# Patient Record
Sex: Male | Born: 1955 | Race: White | Hispanic: No | Marital: Married | Smoking: Never smoker
Health system: Southern US, Community
[De-identification: ages and names within clinical notes are randomized; demographics above are authoritative.]

## PROBLEM LIST (undated history)

## (undated) DIAGNOSIS — F319 Bipolar disorder, unspecified: Secondary | ICD-10-CM

## (undated) DIAGNOSIS — K219 Gastro-esophageal reflux disease without esophagitis: Secondary | ICD-10-CM

## (undated) DIAGNOSIS — K589 Irritable bowel syndrome without diarrhea: Secondary | ICD-10-CM

## (undated) DIAGNOSIS — E785 Hyperlipidemia, unspecified: Secondary | ICD-10-CM

## (undated) DIAGNOSIS — I2510 Atherosclerotic heart disease of native coronary artery without angina pectoris: Secondary | ICD-10-CM

## (undated) DIAGNOSIS — I10 Essential (primary) hypertension: Secondary | ICD-10-CM

## (undated) HISTORY — DX: Hyperlipidemia, unspecified: E78.5

## (undated) HISTORY — DX: Atherosclerotic heart disease of native coronary artery without angina pectoris: I25.10

## (undated) HISTORY — DX: Gastro-esophageal reflux disease without esophagitis: K21.9

## (undated) HISTORY — DX: Essential (primary) hypertension: I10

## (undated) HISTORY — DX: Bipolar disorder, unspecified: F31.9

## (undated) HISTORY — DX: Irritable bowel syndrome, unspecified: K58.9

---

## 2000-10-26 HISTORY — PX: APPENDECTOMY: SHX54

## 2000-12-12 ENCOUNTER — Encounter: Payer: Self-pay | Admitting: Surgery

## 2000-12-12 ENCOUNTER — Inpatient Hospital Stay (HOSPITAL_COMMUNITY): Admission: EM | Admit: 2000-12-12 | Discharge: 2000-12-20 | Payer: Self-pay | Admitting: Emergency Medicine

## 2005-01-26 HISTORY — PX: TRANSTHORACIC ECHOCARDIOGRAM: SHX275

## 2005-01-30 ENCOUNTER — Inpatient Hospital Stay (HOSPITAL_COMMUNITY): Admission: EM | Admit: 2005-01-30 | Discharge: 2005-02-01 | Payer: Self-pay | Admitting: Emergency Medicine

## 2005-03-17 ENCOUNTER — Ambulatory Visit (HOSPITAL_COMMUNITY): Admission: RE | Admit: 2005-03-17 | Discharge: 2005-03-18 | Payer: Self-pay | Admitting: Cardiovascular Disease

## 2005-03-17 HISTORY — PX: CARDIAC CATHETERIZATION: SHX172

## 2005-04-14 ENCOUNTER — Ambulatory Visit (HOSPITAL_COMMUNITY): Admission: RE | Admit: 2005-04-14 | Discharge: 2005-04-15 | Payer: Self-pay | Admitting: Cardiovascular Disease

## 2005-04-14 HISTORY — PX: CARDIAC CATHETERIZATION: SHX172

## 2009-01-26 HISTORY — PX: NM MYOCAR PERF WALL MOTION: HXRAD629

## 2011-01-08 ENCOUNTER — Ambulatory Visit (INDEPENDENT_AMBULATORY_CARE_PROVIDER_SITE_OTHER): Payer: PRIVATE HEALTH INSURANCE

## 2011-01-08 DIAGNOSIS — M5430 Sciatica, unspecified side: Secondary | ICD-10-CM

## 2011-01-08 DIAGNOSIS — M545 Low back pain: Secondary | ICD-10-CM

## 2011-05-21 ENCOUNTER — Ambulatory Visit (INDEPENDENT_AMBULATORY_CARE_PROVIDER_SITE_OTHER): Payer: PRIVATE HEALTH INSURANCE | Admitting: Family Medicine

## 2011-05-21 VITALS — BP 115/75 | HR 66 | Temp 98.1°F | Resp 16 | Ht 67.0 in | Wt 202.4 lb

## 2011-05-21 DIAGNOSIS — D649 Anemia, unspecified: Secondary | ICD-10-CM

## 2011-05-21 LAB — POCT CBC
Hemoglobin: 11.5 g/dL — AB (ref 14.1–18.1)
Lymph, poc: 2.5 (ref 0.6–3.4)
MCHC: 31.2 g/dL — AB (ref 31.8–35.4)
MID (cbc): 0.5 (ref 0–0.9)
MPV: 11 fL (ref 0–99.8)
POC Granulocyte: 3.8 (ref 2–6.9)
POC MID %: 7.5 %M (ref 0–12)
Platelet Count, POC: 149 10*3/uL (ref 142–424)
RDW, POC: 15.4 %

## 2011-05-21 LAB — POCT SEDIMENTATION RATE: POCT SED RATE: 22 mm/hr (ref 0–22)

## 2011-05-21 MED ORDER — PRENATAL PLUS 27-1 MG PO TABS: 1.0000 | ORAL_TABLET | Freq: Every day | ORAL | Status: DC

## 2011-05-21 NOTE — Progress Notes (Signed)
  Subjective:    Patient ID: Harold Newman, male    DOB: 1955/11/27, 56 y.o.   MRN: [id number]  HPI  Recent OV with Dr. Haywood Lasso and noted that patient appeared pale and ordered a CBC. Anemia noted and patient referred here.  Some fatigue, was able to walk 2 miles the other day without fatigue Some leg cramping  Denies rectal bleeding or UGI symptoms.  Diet low in foods with Iron  Denies unintentional weight loss Denies LE edema  Has been taking OTC iron and has felt much better  Never had colonoscopy; Did have double contrast BE 2002 and was told colon was normal No history of rectal bleeding;  PMH/ GERD           Mood Disorder  Primary MD- Dr. Foy Guadalajara  Review of Systems     Objective:   Physical Exam  Constitutional: He appears well-developed.  HENT:  Head: Normocephalic.  Mouth/Throat: Oropharynx is clear and moist.  Neck: Neck supple. No thyromegaly present.  Cardiovascular: Normal rate, regular rhythm and normal heart sounds.   Pulmonary/Chest: Effort normal and breath sounds normal.  Abdominal: Soft. Bowel sounds are normal. There is no hepatosplenomegaly.  Genitourinary: Rectum normal. Guaiac negative stool.  Lymphadenopathy:    He has no cervical adenopathy.  Skin: No rash noted.        Assessment & Plan:   1. Anemia  POCT CBC, POCT SEDIMENTATION RATE, Ferritin, prenatal vitamin w/FE, FA (PRENATAL 1 + 1) 27-1 MG TABS, IFOBT POC (occult bld, rslt in office)   Patient advised the importance of a diagnostic colonoscopy given age and anemia. Patient declined our practice to schedule referral. Patient insists that he will schedule colonoscopy at [facility]. Aware without a colonoscopy he can miss early diagnosis of colon cancer

## 2011-05-22 ENCOUNTER — Telehealth: Payer: Self-pay

## 2011-05-22 NOTE — Telephone Encounter (Signed)
Labs not back yet. Pt notified.

## 2011-05-22 NOTE — Telephone Encounter (Signed)
Pt would like to know if labs are in.

## 2011-05-28 ENCOUNTER — Telehealth: Payer: Self-pay | Admitting: Family Medicine

## 2011-05-28 ENCOUNTER — Other Ambulatory Visit: Payer: Self-pay | Admitting: Family Medicine

## 2011-05-28 DIAGNOSIS — K589 Irritable bowel syndrome without diarrhea: Secondary | ICD-10-CM

## 2011-05-28 MED ORDER — DICYCLOMINE HCL 10 MG PO CAPS: 10.0000 mg | ORAL_CAPSULE | Freq: Three times a day (TID) | ORAL | Status: DC

## 2011-05-28 NOTE — Telephone Encounter (Signed)
LM on VM asking patient for return call to discuss labs. Needs colonoscopy.

## 2011-05-29 ENCOUNTER — Telehealth: Payer: Self-pay

## 2011-05-29 NOTE — Telephone Encounter (Signed)
Pt states that he was seen last Thursday and had a stool sample taken and would like to know the results.

## 2011-05-29 NOTE — Telephone Encounter (Signed)
PLEASE MAIL HIM A COPY OF THE RECENT LAB RESULTS.

## 2011-05-31 NOTE — Telephone Encounter (Signed)
Test done in office was negative

## 2011-05-31 NOTE — Telephone Encounter (Signed)
ADVISED PT OF LAB RESULTS.

## 2011-05-31 NOTE — Telephone Encounter (Signed)
Duplicate message. 

## 2011-05-31 NOTE — Telephone Encounter (Signed)
Left a message for patient to call back with clarification for which labs he is speaking of. Did he bring in stool samples or was it just in house?

## 2011-07-12 ENCOUNTER — Ambulatory Visit (INDEPENDENT_AMBULATORY_CARE_PROVIDER_SITE_OTHER): Payer: PRIVATE HEALTH INSURANCE | Admitting: Emergency Medicine

## 2011-07-12 VITALS — BP 128/83 | HR 69 | Temp 98.1°F | Resp 16 | Ht 68.0 in | Wt 201.0 lb

## 2011-07-12 DIAGNOSIS — D649 Anemia, unspecified: Secondary | ICD-10-CM

## 2011-07-12 LAB — POCT CBC
Granulocyte percent: 44.6 %G (ref 37–80)
Hemoglobin: 11.7 g/dL — AB (ref 14.1–18.1)
MPV: 10.5 fL (ref 0–99.8)
POC Granulocyte: 2.4 (ref 2–6.9)
POC MID %: 8.9 %M (ref 0–12)
RBC: 4.42 M/uL — AB (ref 4.69–6.13)
WBC: 5.3 10*3/uL (ref 4.6–10.2)

## 2011-07-12 NOTE — Progress Notes (Signed)
Patient Name: Harold Newman Date of Birth: September 15, 1955 Medical Record Number: [id number] Gender: male Date of Encounter: 07/12/2011  History of Present Illness:  Harold Newman is a 56 y.o. very pleasant male patient who presents with the following:  History of anemia and scheduled for colonoscopy to evaluate wants to obtain a fresh CBC to re-evaluate his anemia No symptoms and no hematochezia, abdominal pain, stool changes, or melena or upper GI complaints  There is no problem list on file for this patient.  No past medical history on file. No past surgical history on file. History  Substance Use Topics  . Smoking status: Never Smoker   . Smokeless tobacco: Not on file  . Alcohol Use: Not on file   No family history on file. No Known Allergies  Medication list has been reviewed and updated.  Prior to Admission medications   Medication Sig Start Date End Date Taking? Authorizing Provider  amLODipine (NORVASC) 5 MG tablet Take 5 mg by mouth daily.   Yes Historical Provider, MD  aspirin 81 MG tablet Take 81 mg by mouth daily.   Yes Historical Provider, MD  clopidogrel (PLAVIX) 75 MG tablet Take 75 mg by mouth daily.   Yes Historical Provider, MD  co-enzyme Q-10 30 MG capsule Take 200 mg by mouth daily.   Yes Historical Provider, MD  D-Ribose POWD 5 g by Does not apply route daily.   Yes Historical Provider, MD  dicyclomine (BENTYL) 10 MG capsule Take 1 capsule (10 mg total) by mouth 4 (four) times daily -  before meals and at bedtime. 05/28/11 05/27/12 Yes Dois Davenport, MD  IRON PO Take 30 mg by mouth daily.   Yes Historical Provider, MD  lansoprazole (PREVACID) 15 MG capsule Take 30 mg by mouth daily.    Yes Historical Provider, MD  LevOCARNitine L-Tartrate (L-CARNITINE) 500 MG CAPS Take 500 mg by mouth daily.   Yes Historical Provider, MD  lithium carbonate (ESKALITH) 450 MG CR tablet Take 675 mg by mouth daily.   Yes Historical Provider, MD  LORazepam (ATIVAN) 1 MG tablet  Take 1 mg by mouth every 8 (eight) hours.   Yes Historical Provider, MD  losartan (COZAAR) 25 MG tablet Take 25 mg by mouth daily.   Yes Historical Provider, MD  metoprolol succinate (TOPROL-XL) 100 MG 24 hr tablet Take 37.5 mg by mouth daily. Take with or immediately following a meal.   Yes Historical Provider, MD  Multiple Vitamins-Minerals (MULTIVITAMIN WITH MINERALS) tablet Take 1 tablet by mouth daily.   Yes Historical Provider, MD  niacin (NIASPAN) 1000 MG CR tablet Take 1,000 mg by mouth at bedtime.   Yes Historical Provider, MD  rosuvastatin (CRESTOR) 40 MG tablet Take 40 mg by mouth daily.   Yes Historical Provider, MD  vitamin B-12 (CYANOCOBALAMIN) 1000 MCG tablet Take 1,000 mcg by mouth 5 (five) times daily.   Yes Historical Provider, MD  prenatal vitamin w/FE, FA (PRENATAL 1 + 1) 27-1 MG TABS Take 1 tablet by mouth daily. 05/21/11   Dois Davenport, MD    Review of Systems:  As per HPI, otherwise negative.    Physical Examination: Filed Vitals:   07/12/11 1110  BP: 128/83  Pulse: 69  Temp: 98.1 F (36.7 C)  Resp: 16   Filed Vitals:   07/12/11 1110  Height: 5\' 8"  (1.727 m)  Weight: 201 lb (91.173 kg)   Body mass index is 30.56 kg/(m^2). Ideal Body Weight: Weight in (lb) to  have BMI = 25: 164.1    GEN: WDWN, NAD, Non-toxic, Alert & Oriented x 3 HEENT: Atraumatic, Normocephalic.  Ears and Nose: No external deformity. EXTR: No clubbing/cyanosis/edema NEURO: Normal gait.  PSYCH: Normally interactive. Conversant. Not depressed or anxious appearing.  Calm demeanor.   Assessment and Plan: Check CBC and follow up with GI physician.  Carmelina Dane, MD

## 2011-07-12 NOTE — Progress Notes (Signed)
  Subjective:    Patient ID: Harold Newman, male    DOB: [birth year], 56 y.o.   MRN: [id number]  HPI    Review of Systems     Objective:   Physical Exam        Assessment & Plan:

## 2011-07-12 NOTE — Progress Notes (Signed)
  Subjective:    Patient ID: Harold Newman, male    DOB: 07/14/1955, 56 y.o.   MRN: [id number]  HPI    Review of Systems     Objective:   Physical Exam        Assessment & Plan:   Results for orders placed in visit on 07/12/11  POCT CBC      Component Value Range   WBC 5.3  4.6 - 10.2 K/uL   Lymph, poc 2.5  0.6 - 3.4   POC LYMPH PERCENT 46.5  10 - 50 %L   MID (cbc) 0.5  0 - 0.9   POC MID % 8.9  0 - 12 %M   POC Granulocyte 2.4  2 - 6.9   Granulocyte percent 44.6  37 - 80 %G   RBC 4.42 (*) 4.69 - 6.13 M/uL   Hemoglobin 11.7 (*) 14.1 - 18.1 g/dL   HCT, POC 34.5 (*) 43.5 - 53.7 %   MCV 78.0 (*) 80 - 97 fL   MCH, POC 26.5 (*) 27 - 31.2 pg   MCHC 33.9  31.8 - 35.4 g/dL   RDW, POC 18.3     Platelet Count, POC 144  142 - 424 K/uL   MPV 10.5  0 - 99.8 fL     

## 2011-07-12 NOTE — Progress Notes (Signed)
  Subjective:    Patient ID: Harold Newman, male    DOB: 10/01/1955, 56 y.o.   MRN: [id number]  HPI    Review of Systems     Objective:   Physical Exam        Assessment & Plan:   Results for orders placed in visit on 07/12/11  POCT CBC      Component Value Range   WBC 5.3  4.6 - 10.2 K/uL   Lymph, poc 2.5  0.6 - 3.4   POC LYMPH PERCENT 46.5  10 - 50 %L   MID (cbc) 0.5  0 - 0.9   POC MID % 8.9  0 - 12 %M   POC Granulocyte 2.4  2 - 6.9   Granulocyte percent 44.6  37 - 80 %G   RBC 4.42 (*) 4.69 - 6.13 M/uL   Hemoglobin 11.7 (*) 14.1 - 18.1 g/dL   HCT, POC 14.4 (*) 31.5 - 53.7 %   MCV 78.0 (*) 80 - 97 fL   MCH, POC 26.5 (*) 27 - 31.2 pg   MCHC 33.9  31.8 - 35.4 g/dL   RDW, POC 40.0     Platelet Count, POC 144  142 - 424 K/uL   MPV 10.5  0 - 99.8 fL

## 2011-08-27 DIAGNOSIS — K589 Irritable bowel syndrome without diarrhea: Secondary | ICD-10-CM | POA: Insufficient documentation

## 2011-08-27 HISTORY — PX: COLONOSCOPY: SHX174

## 2012-08-26 ENCOUNTER — Encounter: Payer: Self-pay | Admitting: Cardiovascular Disease

## 2012-08-31 ENCOUNTER — Telehealth: Payer: Self-pay | Admitting: Cardiovascular Disease

## 2012-08-31 MED ORDER — METOPROLOL SUCCINATE ER 25 MG PO TB24: ORAL_TABLET | ORAL | Status: DC

## 2012-08-31 MED ORDER — AMLODIPINE BESYLATE 5 MG PO TABS: 5.0000 mg | ORAL_TABLET | Freq: Every day | ORAL | Status: DC

## 2012-08-31 MED ORDER — ROSUVASTATIN CALCIUM 40 MG PO TABS: 40.0000 mg | ORAL_TABLET | Freq: Every day | ORAL | Status: DC

## 2012-08-31 NOTE — Telephone Encounter (Signed)
Pt called back and informed samples will be left at front desk.  Pt stated he needs to pick up Rxs for Norvasc and Metoprolol b/c he sends them to [place].  Pt informed Dr. Pierre Bali, CMA will be notified.  Pt also informed he may not receive 3 refills as he has not been seen since Nov 2013 and refills are usually given until the next appt.  Pt verbalized understanding and stated a 90-day supply will be sufficient.    Message forwarded to Dr. Pierre Bali, CMA.  Envelope w/ samples on [name]'s desk awaiting signed Rx for refills to be placed with them.  Please call pt when complete.

## 2012-08-31 NOTE — Telephone Encounter (Signed)
Amlodipine 5 mg and Toprol XL 25 MG refills printed for patient to pick up.

## 2012-08-31 NOTE — Telephone Encounter (Signed)
Would like to pick up some samples of Crestor 20 mg please. Also wants to pick up a prescription for his Norvasc 5 mg #90 and 3 refills and  Metoprolol 50 mg #90 and 3 refills.

## 2012-08-31 NOTE — Telephone Encounter (Signed)
Returned call.  Left message to call back before 4pm.  Samples left at front desk for pt pick up.  Lot: [id number] Exp: 03/2015.

## 2012-09-01 NOTE — Telephone Encounter (Signed)
Returned call.  Left message that everything requested is at the front desk for pick up.

## 2012-11-18 ENCOUNTER — Telehealth: Payer: Self-pay | Admitting: Cardiovascular Disease

## 2012-11-18 MED ORDER — ROSUVASTATIN CALCIUM 20 MG PO TABS: 40.0000 mg | ORAL_TABLET | Freq: Every day | ORAL | Status: DC

## 2012-11-18 NOTE — Telephone Encounter (Signed)
Returned call.  Left message to call back before 4pm.  Pt overdue for f/u appt and this was explained to him on 8.6.14 telephone call.  Appt was due in May 2014 and pt is scheduled to be seen December 2014.  Pt will need to schedule sooner appt w/ Dr. Tresa Endo.  Will inform when call returned.

## 2012-11-18 NOTE — Telephone Encounter (Signed)
Would like some samples of 20 mg Crestor .Marland KitchenWould like to have a prescription written 50 mg Toprol XL and he will pick it up .Marland Kitchen  Please call   T

## 2012-11-18 NOTE — Telephone Encounter (Signed)
Pt called back and verified x 2.  Pt informed message received and samples are available.  Pt also informed he will need a sooner appt since he was supposed to be seen in May, informed of this in August and appt not scheduled until December.  Pt stated he is currently uninsured and will not be getting insurance until late November/early December.  Pt informed RN understands and advised he keep the December appt for further samples/refills on meds.    Pt also informed he received a refill on Toprol in August.  Pt stated he has the Toprol 25 mg and needs Rx for 50 mg.  Stated it's easier and more cost effective for him to take half of the 25 mg and half of the 50 mg tabs to make 37.5 mg.  Pt informed RN will share that information w/ Burna Mortimer, CMA to discuss w/ Dr. Tresa Endo as they will have to write that Rx.  Pt stated he doesn't understand why it can't be done b/c it has been done in the past.  Pt informed RN will share information as previously stated and it is up to Dr. Tresa Endo if he would like to prescribe it that way.  Pt verbalized understanding and agreed w/ plan.  Samples at front desk for pick up.  Message forwarded to W. Waddell, CMA.

## 2013-01-10 NOTE — Telephone Encounter (Signed)
Patient has appointment tomorrow. Dr. Tresa Endo will discuss his concerns at that time.

## 2013-01-11 ENCOUNTER — Encounter: Payer: Self-pay | Admitting: Cardiovascular Disease

## 2013-01-11 ENCOUNTER — Ambulatory Visit (INDEPENDENT_AMBULATORY_CARE_PROVIDER_SITE_OTHER): Payer: Self-pay | Admitting: Cardiovascular Disease

## 2013-01-11 VITALS — BP 130/90 | HR 69 | Ht 69.0 in | Wt 195.7 lb

## 2013-01-11 DIAGNOSIS — E785 Hyperlipidemia, unspecified: Secondary | ICD-10-CM

## 2013-01-11 DIAGNOSIS — I10 Essential (primary) hypertension: Secondary | ICD-10-CM

## 2013-01-11 DIAGNOSIS — I2510 Atherosclerotic heart disease of native coronary artery without angina pectoris: Secondary | ICD-10-CM

## 2013-01-11 DIAGNOSIS — F319 Bipolar disorder, unspecified: Secondary | ICD-10-CM

## 2013-01-11 MED ORDER — CLOPIDOGREL BISULFATE 75 MG PO TABS: 75.0000 mg | ORAL_TABLET | Freq: Every day | ORAL | Status: DC

## 2013-01-11 MED ORDER — ROSUVASTATIN CALCIUM 40 MG PO TABS: 40.0000 mg | ORAL_TABLET | Freq: Every day | ORAL | Status: DC

## 2013-01-11 MED ORDER — METOPROLOL SUCCINATE ER 25 MG PO TB24: ORAL_TABLET | ORAL | Status: DC

## 2013-01-11 NOTE — Patient Instructions (Signed)
Your physician recommends that you schedule a follow-up appointment in: 6 MONTHS. Your physician has recommended you make the following change in your medication:RESTART THE PLAVIX ( CLOPIDOGREL)

## 2013-01-12 ENCOUNTER — Encounter: Payer: Self-pay | Admitting: Cardiovascular Disease

## 2013-01-12 ENCOUNTER — Telehealth: Payer: Self-pay | Admitting: Cardiovascular Disease

## 2013-01-12 DIAGNOSIS — F319 Bipolar disorder, unspecified: Secondary | ICD-10-CM | POA: Insufficient documentation

## 2013-01-12 DIAGNOSIS — I2510 Atherosclerotic heart disease of native coronary artery without angina pectoris: Secondary | ICD-10-CM | POA: Insufficient documentation

## 2013-01-12 DIAGNOSIS — E785 Hyperlipidemia, unspecified: Secondary | ICD-10-CM | POA: Insufficient documentation

## 2013-01-12 DIAGNOSIS — I10 Essential (primary) hypertension: Secondary | ICD-10-CM | POA: Insufficient documentation

## 2013-01-12 NOTE — Progress Notes (Signed)
Patient ID: Harold Newman, male   DOB: 01/02/1956, 57 y.o.   MRN: [id number]     HPI: Harold Newman is a 57 y.o. male who presents to the office today for one-year cardiology evaluation.  Mr. Harold Newman has a history of significant hyperlipidemia with markedly elevated small LV also particles. [facility] 2007 he was found to have subtotal LAD stenosis as well as total occlusion of the circumflex vessel with collaterals after a nuclear study demonstrated multiple areas of ischemia. He underwent initial intervention to his LAD totally underwent successful intervention to his chronically occluded circumflex vessel with reestablishment of antegrade flow. His last nuclear perfusion study in 2011 continued to show fairly normal perfusion without scar or ischemia.  Over the past year, he has continued to do well. He states his blood pressure at home typically runs in the 1:15 to 65 range. And has been taking metoprolol succinate 37.5 mg, losartan 25 mg. He also has a history of GERD for which he takes Prevacid. He had been on dual antiplatelet therapy but this was stopped in August 2013 when he was found to have a blood in his stool. He has not had any further blood in the stool. He remains active and exercises with walking at least 2 miles per day. He denies shortness of breath. There also was a bipolar disorder which is controlled with medical therapy. He is tolerating combination therapy and is now back on Niaspan 1000 mg in addition to Crestor 40 mg daily.  History reviewed. No pertinent past medical history.  History reviewed. No pertinent past surgical history.  No Known Allergies  Current Outpatient Prescriptions  Medication Sig Dispense Refill  . amLODipine (NORVASC) 5 MG tablet Take 1 tablet (5 mg total) by mouth daily.  90 tablet  1  . aspirin 81 MG tablet Take 81 mg by mouth daily.      Marland Kitchen co-enzyme Q-10 30 MG capsule Take 200 mg by mouth daily.      . Cyanocobalamin (VITAMIN B-12 PO) Take by  mouth daily. 5000 i.u.      Marland Kitchen D-Ribose POWD 5 g by Does not apply route daily.      . lansoprazole (PREVACID) 15 MG capsule Take 30 mg by mouth daily.       . LevOCARNitine L-Tartrate (L-CARNITINE) 500 MG CAPS Take 500 mg by mouth daily.      Marland Kitchen lithium carbonate (ESKALITH) 450 MG CR tablet Take 675 mg by mouth daily.      Marland Kitchen LORazepam (ATIVAN) 1 MG tablet Take 1 mg by mouth every 8 (eight) hours.      Marland Kitchen losartan (COZAAR) 25 MG tablet Take 25 mg by mouth daily.      . metoprolol succinate (TOPROL XL) 25 MG 24 hr tablet Take 1 & 1/2 tablet  135 tablet  3  . Multiple Vitamins-Minerals (MULTIVITAMIN WITH MINERALS) tablet Take 1 tablet by mouth daily.      . rosuvastatin (CRESTOR) 40 MG tablet Take 1 tablet (40 mg total) by mouth daily.  90 tablet  3  . clopidogrel (PLAVIX) 75 MG tablet Take 1 tablet (75 mg total) by mouth daily.  90 tablet  3  . dicyclomine (BENTYL) 10 MG capsule Take 1 capsule (10 mg total) by mouth 4 (four) times daily -  before meals and at bedtime.  30 capsule  3  . niacin (NIASPAN) 1000 MG CR tablet Take 1,000 mg by mouth at bedtime.       No  current facility-administered medications for this visit.    History   Social History  . Marital Status: Married    Spouse Name: N/A    Number of Children: N/A  . Years of Education: N/A   Occupational History  . Not on file.   Social History Main Topics  . Smoking status: Never Smoker   . Smokeless tobacco: Not on file  . Alcohol Use: Not on file  . Drug Use: Not on file  . Sexual Activity: Not on file   Other Topics Concern  . Not on file   Social History Narrative  . No narrative on file   Socially, he is now into a having been divorced for many years. He has 3 children 2 grandchildren. Has no tobacco or alcohol use.  Family History  Problem Relation Age of Onset  . Anemia Mother   . Heart failure Father     ROS is negative for fevers, chills or night sweats. He denies skin rash. Denies flushing to Niaspan. He  denies visual changes. He denies in hearing. There is no wheezing. He denies shortness of breath. Denies chest tightness. He denies PND or orthopnea. There are no episodes of presyncope or syncope. He denies any nausea vomiting or diarrhea. He denies any awareness of blood in the stool or urine. He denies claudication. He denies edema. There is no diabetes. Psychiatrically he has been controlled on with reference to his bipolar disorder. He denies cold or heat intolerance. There is no diabetes   Other comprehensive 14 point system review is negative.  PE BP 130/90  Pulse 69  Ht 5\' 9"  (1.753 m)  Wt 195 lb 11.2 oz (88.769 kg)  BMI 28.89 kg/m2  General: Alert, oriented, no distress.  Skin: normal turgor, no rashes HEENT: Normocephalic, atraumatic. Pupils round and reactive; sclera anicteric;no lid lag.  Nose without nasal septal hypertrophy Mouth/Parynx benign; Mallinpatti scale 3 Neck: No JVD, no carotid briuts Lungs: clear to ausculatation and percussion; no wheezing or rales Chest wall: no tenderness to palpitation Heart: RRR, s1 s2 normal 1/6 systolic murmur Abdomen: Mild central adiposity; soft, nontender; no hepatosplenomehaly, BS+; abdominal aorta nontender and not dilated by palpation. Back: no CVA tenderness Pulses 2+ Extremities: no clubbing cyanosis or edema, Homan's sign negative  Neurologic: grossly nonfocal Psychologic: normal affect and mood.  ECG: Sinus rhythm at 69 beats per minute. Normal intervals. No ectopy.  LABS:  BMET No results found for this basename: na, k, cl, co2, glucose, bun, creatinine, calcium, gfrnonaa, gfraa     Hepatic Function Panel  No results found for this basename: prot, albumin, ast, alt, alkphos, bilitot, bilidir, ibili     CBC    Component Value Date/Time   WBC 5.3 07/12/2011 1138   RBC 4.42* 07/12/2011 1138   HGB 11.7* 07/12/2011 1138   HCT 34.5* 07/12/2011 1138   MCV 78.0* 07/12/2011 1138   MCH 26.5* 07/12/2011 1138   MCHC 33.9  07/12/2011 1138     BNP No results found for this basename: probnp    Lipid Panel  No results found for this basename: chol, trig, hdl, cholhdl, vldl, ldlcalc     RADIOLOGY: No results found.    ASSESSMENT AND PLAN: Mr. 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 Subsequent nuclear studies have shown normal perfusion. His last study was in 2011. He is on combination therapy and is making significant improvement  in change of diet and reduction in his parameters. I did discuss with him recent data concerning long-term potential benefit of dual antiplatelet therapy. I have suggested perhaps trying to institute Plavix. He will try this perhaps initially at every other day and if tolerable resumed taking it daily with his 81 mg aspirin. He is on Prevacid. Blood pressure is controlled particularly at home. He will need subsequent laboratory. We did discuss the potential of doing another stress test next year but have not scheduled this presently. I will see him in 6 months for cardiology reevaluation.     Lennette Bihari, MD, [facility]  01/12/2013 8:28 PM

## 2013-01-12 NOTE — Telephone Encounter (Signed)
Returned patient's call in reference to his metoprolol prescription. Patient requests 2 different  Prescriptions. He would like to have a 50 mg RX to use along with his 25 mg RX. He states that it is more cost effective for him to take 1/2 tablet of each once daily than it is for him to take 1 & 1/2 of the 25 mg tablet. Informed him this will need to be approved by Dr. Tresa Endo. I informed the patient that Dr. Tresa Endo will be out of the office until 01/23/13. He states he will call back at that time for the request.

## 2013-01-12 NOTE — Telephone Encounter (Signed)
Please call-concerning his Toprol prescription he received yesterday.

## 2013-01-13 ENCOUNTER — Encounter: Payer: Self-pay | Admitting: Cardiovascular Disease

## 2013-02-13 ENCOUNTER — Other Ambulatory Visit: Payer: Self-pay | Admitting: Cardiovascular Disease

## 2013-02-13 NOTE — Telephone Encounter (Signed)
Needs a prescription for 5mg  Norvasc to send off  He just need the prescription .Marland Kitchen. He is also asking for samples of 20 mg Crestor .Marland Kitchen. Please call if you have any questions

## 2013-02-13 NOTE — Telephone Encounter (Signed)
Returned call and pt verified x 2.  Pt stated he needs printed prescriptions for Norvasc 5 mg one tab daily and Toprol XL 50 mg half tab daily.  Stated Burna MortimerWanda told him to call back when he needed the Toprol Rx and she would get Dr. Tresa EndoKelly to sign it.  Pt informed Dr. Tresa EndoKelly is out of the office until next Monday.  Pt stated he does not need it right now and is aware Dr. Tresa EndoKelly is out this week.  Pt informed Burna MortimerWanda will be notified of request to get prescriptions printed.  Pt verbalized understanding and agreed w/ plan.  Message forwarded to [place]Wanda, [place].  Samples of Crestor 20 mg left at front desk for pick up.

## 2013-02-22 ENCOUNTER — Telehealth: Payer: Self-pay | Admitting: Cardiovascular Disease

## 2013-02-22 ENCOUNTER — Other Ambulatory Visit: Payer: Self-pay | Admitting: Pharmacist Clinician (PhC)/ Clinical Pharmacy Specialist

## 2013-02-22 MED ORDER — METOPROLOL SUCCINATE ER 50 MG PO TB24: ORAL_TABLET | ORAL | Status: DC

## 2013-02-22 MED ORDER — AMLODIPINE BESYLATE 5 MG PO TABS: 5.0000 mg | ORAL_TABLET | Freq: Every day | ORAL | Status: DC

## 2013-02-22 NOTE — Telephone Encounter (Signed)
Pt take 1/2 of 25mg  and 1/2 of 50mg  metoprolol succinate to equal 37.5mg  qd.  Has no rx insurance, states this is cheaper for him, [place]

## 2013-03-29 ENCOUNTER — Other Ambulatory Visit: Payer: Self-pay | Admitting: *Deleted

## 2013-03-29 ENCOUNTER — Telehealth: Payer: Self-pay | Admitting: Cardiovascular Disease

## 2013-03-29 MED ORDER — ROSUVASTATIN CALCIUM 40 MG PO TABS: 40.0000 mg | ORAL_TABLET | Freq: Every day | ORAL | Status: DC

## 2013-03-29 NOTE — Telephone Encounter (Signed)
Would like some samples of Crestor 20 mg please. °

## 2013-03-29 NOTE — Telephone Encounter (Signed)
Samples of crestor 20mg  given

## 2013-03-30 ENCOUNTER — Other Ambulatory Visit: Payer: Self-pay | Admitting: *Deleted

## 2013-03-31 NOTE — Telephone Encounter (Signed)
Returned call.  Pt informed no samples available.  Pt verbalized understanding. 

## 2013-03-31 NOTE — Telephone Encounter (Signed)
Pt called again today,wants to know if we have any samples of Crestor 20 mg.

## 2013-04-25 ENCOUNTER — Telehealth: Payer: Self-pay | Admitting: Cardiovascular Disease

## 2013-04-25 MED ORDER — ROSUVASTATIN CALCIUM 40 MG PO TABS: 40.0000 mg | ORAL_TABLET | Freq: Every day | ORAL | Status: DC

## 2013-04-25 NOTE — Telephone Encounter (Signed)
Would like to get some samples of crestor 20mg  .. Please Call   Thanks

## 2013-04-25 NOTE — Telephone Encounter (Signed)
LEFT MESSAGE ON VOICEMAIL -20 MG SAMPLES AVAILABLE TO PICK UP

## 2013-05-12 ENCOUNTER — Telehealth: Payer: Self-pay | Admitting: Cardiovascular Disease

## 2013-05-12 NOTE — Telephone Encounter (Signed)
Would like some samples of Crestor 20 mg please. °

## 2013-05-15 MED ORDER — ROSUVASTATIN CALCIUM 40 MG PO TABS: 40.0000 mg | ORAL_TABLET | Freq: Every day | ORAL | Status: DC

## 2013-05-15 NOTE — Telephone Encounter (Signed)
Patient notified samples will be left at front desk.  

## 2013-05-22 NOTE — Telephone Encounter (Signed)
This encounter is closed. 

## 2013-06-09 ENCOUNTER — Other Ambulatory Visit: Payer: Self-pay | Admitting: *Deleted

## 2013-06-09 ENCOUNTER — Telehealth: Payer: Self-pay | Admitting: Cardiovascular Disease

## 2013-06-09 MED ORDER — ROSUVASTATIN CALCIUM 40 MG PO TABS: 40.0000 mg | ORAL_TABLET | Freq: Every day | ORAL | Status: DC

## 2013-06-09 MED ORDER — AMLODIPINE BESYLATE 5 MG PO TABS: 5.0000 mg | ORAL_TABLET | Freq: Every day | ORAL | Status: DC

## 2013-06-09 MED ORDER — METOPROLOL SUCCINATE ER 25 MG PO TB24: ORAL_TABLET | ORAL | Status: DC

## 2013-06-09 NOTE — Telephone Encounter (Signed)
Would like samples of Crestor 20 mg.He also wants written prescriptions for Crestor 40 mg,Norvasc 5 mg and Toprol XL 25 mg please.

## 2013-06-09 NOTE — Telephone Encounter (Signed)
Left patient VM that his samples would be at front desk and other Rx were sent into pharmacy

## 2013-06-22 ENCOUNTER — Telehealth: Payer: Self-pay | Admitting: Cardiovascular Disease

## 2013-06-22 NOTE — Telephone Encounter (Signed)
Called patient. He mails in Rx to a drug store out of [place] and had paper script for amlodipine, crestor, toprol that were provided last week but not signed by Dr. Tresa Endo. RN informed him that Dr. Tresa Endo would not be in the office until next Wednesday. He will bring the scripts by for Dr. Tresa Endo to sign. Will forward to [place] to keep eye out for paper prescription

## 2013-06-22 NOTE — Telephone Encounter (Signed)
He picked up some prescriptions from here last week,but they were not signed.He said he would bring them back today.

## 2013-06-29 ENCOUNTER — Telehealth: Payer: Self-pay | Admitting: *Deleted

## 2013-06-29 NOTE — Telephone Encounter (Signed)
Left message for patient informing him that his mail-order prescriptions have signed. Please call back to inform us if he will be picking these up or does he want them mailed to him.

## 2013-06-29 NOTE — Telephone Encounter (Signed)
Prescriptions left at front desk.

## 2013-06-29 NOTE — Telephone Encounter (Signed)
Please leave prescriptions at the front desk.

## 2013-07-06 ENCOUNTER — Telehealth: Payer: Self-pay | Admitting: Cardiovascular Disease

## 2013-07-06 MED ORDER — NITROGLYCERIN 0.4 MG SL SUBL: 0.4000 mg | SUBLINGUAL_TABLET | SUBLINGUAL | Status: DC | PRN

## 2013-07-06 NOTE — Telephone Encounter (Signed)
Rx refill sent to patient pharmacy   

## 2013-07-06 NOTE — Telephone Encounter (Signed)
Need a new prescription for Nitroglycerin bottle,he did not know the milligram. Please call to [phone].

## 2013-07-06 NOTE — Addendum Note (Signed)
Addended by: Abram Sander on: 07/06/2013 11:23 AM   Modules accepted: Orders

## 2013-07-08 ENCOUNTER — Encounter: Payer: Self-pay | Admitting: Cardiovascular Disease

## 2013-07-08 ENCOUNTER — Telehealth: Payer: Self-pay | Admitting: Cardiovascular Disease

## 2013-07-10 NOTE — Telephone Encounter (Signed)
Closed encounter °

## 2013-07-18 ENCOUNTER — Encounter: Payer: Self-pay | Admitting: *Deleted

## 2013-07-20 ENCOUNTER — Ambulatory Visit (INDEPENDENT_AMBULATORY_CARE_PROVIDER_SITE_OTHER): Payer: BC Managed Care – PPO | Admitting: Cardiovascular Disease

## 2013-07-20 ENCOUNTER — Encounter: Payer: Self-pay | Admitting: Cardiovascular Disease

## 2013-07-20 VITALS — BP 129/82 | HR 63 | Ht 68.0 in | Wt 195.0 lb

## 2013-07-20 DIAGNOSIS — E785 Hyperlipidemia, unspecified: Secondary | ICD-10-CM

## 2013-07-20 DIAGNOSIS — R0602 Shortness of breath: Secondary | ICD-10-CM

## 2013-07-20 DIAGNOSIS — I2510 Atherosclerotic heart disease of native coronary artery without angina pectoris: Secondary | ICD-10-CM

## 2013-07-20 DIAGNOSIS — I10 Essential (primary) hypertension: Secondary | ICD-10-CM

## 2013-07-20 MED ORDER — METOPROLOL SUCCINATE ER 50 MG PO TB24: 50.0000 mg | ORAL_TABLET | Freq: Every day | ORAL | Status: DC

## 2013-07-20 MED ORDER — ISOSORBIDE MONONITRATE ER 30 MG PO TB24: 30.0000 mg | ORAL_TABLET | Freq: Every day | ORAL | Status: DC

## 2013-07-20 MED ORDER — LOSARTAN POTASSIUM 25 MG PO TABS: 25.0000 mg | ORAL_TABLET | Freq: Every day | ORAL | Status: DC

## 2013-07-20 NOTE — Patient Instructions (Signed)
Your physician has recommended you make the following change in your medication: increase the metoprolol from 37.5 mg to 50 mg daily. Start new prescription for isosorbide.  Your physician has requested that you have a lexiscan myoview. For further information please visit [email or web address]. Please follow instruction sheet, as given.   Your physician recommends that you schedule a follow-up appointment in: 3-4 weeks.

## 2013-07-21 ENCOUNTER — Encounter: Payer: Self-pay | Admitting: Cardiovascular Disease

## 2013-07-21 DIAGNOSIS — R0602 Shortness of breath: Secondary | ICD-10-CM | POA: Insufficient documentation

## 2013-07-21 NOTE — Progress Notes (Signed)
Patient ID: Harold GovernLeland A Schweitzer, male   DOB: [birth year], 58 y.o.   MRN: [id number]     HPI: Harold Newman is a 58 y.o. male who presents to the office today for a 6 month cardiology evaluation.  Chief complaint is that of decreased exercise tolerance with mild recurrent chest pain.  Harold Newman has a history of significant hyperlipidemia with markedly elevated small LDL small particles. In 2007 he was found to have subtotal LAD stenosis as well as total occlusion of the circumflex vessel with collaterals after a nuclear study demonstrated multiple areas of ischemia. He underwent initial intervention to his LAD totally and underwent successful staged intervention to his chronically occluded circumflex vessel with reestablishment of antegrade flow. His last nuclear perfusion study in 2011 continued to show fairly normal perfusion without scar or ischemia.  He has done exceptionally well since that intervention.  However, since February 2015, he has noticed a change in symptomatology.  He has begun to notice a decline in exercise tolerance with exertional dyspnea and also has noticed some mild episodes of chest pain, which is seen to be mild, but similar in quality to his prior discomfort.  As result, he has reduced his activity level.  He also has a history of bipolar disorder , which has been well controlled with medical therapy.  He has a history of hypertension. He has been on combination therapy for his mixed hyperlipidemia.  There is also a history of GERD.   Past Medical History  Diagnosis Date  . Bipolar disorder   . CAD (coronary artery disease)   . Hypertension   . Hyperlipidemia     Past Surgical History  Procedure Laterality Date  . Appendectomy  10/2000  . Transthoracic echocardiogram  2007    mild MR & TR, AV mildly sclerotic  . Nm myocar perf wall motion  2011    bruce myoview - normal pattern of perfusion, low risk  . Cardiac catheterization  03/17/2005    low-normal LV function, 2  vessel CAD (subtotal occlusion of LAD, stenosis of small 2nd diagonal and with mid LAD narrowing, total occlusion of distal Cfx); PTCA and stent of LAD and 2nd diagonal with 3.5x7618mm Cypher DES (Dr. Nicki Guadalajarahomas Kelly)  . Cardiac catheterization  04/14/2005    2.5x3228mm Cypher DES to L Cfx (Dr. Nicki Guadalajarahomas Kelly)    No Known Allergies  Current Outpatient Prescriptions  Medication Sig Dispense Refill  . amLODipine (NORVASC) 5 MG tablet Take 1 tablet (5 mg total) by mouth daily.  90 tablet  3  . aspirin 325 MG tablet Take 325 mg by mouth daily.      Marland Kitchen. co-enzyme Q-10 30 MG capsule Take 200 mg by mouth daily.      . Cyanocobalamin (VITAMIN B-12 PO) Take by mouth daily. 5000 i.u.      Marland Kitchen. D-Ribose POWD 5 g by Does not apply route daily.      . lansoprazole (PREVACID) 15 MG capsule Take 30 mg by mouth daily.       . LevOCARNitine L-Tartrate (L-CARNITINE) 500 MG CAPS Take 500 mg by mouth daily.      Marland Kitchen. lithium carbonate (ESKALITH) 450 MG CR tablet Take 675 mg by mouth daily.      Marland Kitchen. LORazepam (ATIVAN) 1 MG tablet Take 1 mg by mouth as needed.       Marland Kitchen. losartan (COZAAR) 25 MG tablet Take 1 tablet (25 mg total) by mouth daily.  90 tablet  3  . Multiple  Vitamins-Minerals (MULTIVITAMIN WITH MINERALS) tablet Take 1 tablet by mouth daily.      . niacin (NIASPAN) 1000 MG CR tablet Take 1,000 mg by mouth at bedtime.      . nitroGLYCERIN (NITROSTAT) 0.4 MG SL tablet Place 1 tablet (0.4 mg total) under the tongue every 5 (five) minutes as needed for chest pain.  25 tablet  2  . rosuvastatin (CRESTOR) 40 MG tablet Take 1 tablet (40 mg total) by mouth daily.  90 tablet  3  . dicyclomine (BENTYL) 10 MG capsule Take 10 mg by mouth as needed.      . isosorbide mononitrate (IMDUR) 30 MG 24 hr tablet Take 1 tablet (30 mg total) by mouth daily.  30 tablet  3  . metoprolol succinate (TOPROL-XL) 50 MG 24 hr tablet Take 1 tablet (50 mg total) by mouth daily. Take with or immediately following a meal.  90 tablet  3  . NAFTIN 2 % CREA  Apply 1 application topically as needed.       No current facility-administered medications for this visit.    History   Social History  . Marital Status: Married    Spouse Name: N/A    Number of Children: 3  . Years of Education: 16   Occupational History  . Not on file.   Social History Main Topics  . Smoking status: Never Smoker   . Smokeless tobacco: Never Used  . Alcohol Use: Not on file  . Drug Use: Not on file  . Sexual Activity: Not on file   Other Topics Concern  . Not on file   Social History Narrative  . No narrative on file   Socially, he is now into a having been divorced for many years. He has 3 children 2 grandchildren. No tobacco or alcohol use.  Family History  Problem Relation Age of Onset  . Aplastic anemia Mother   . Hypertension Mother   . Heart failure Father   . Heart attack Maternal Grandfather   . Hypertension Brother   . Parkinson's disease Brother   . Brain cancer Brother     tumor    ROS General: Negative; No fevers, chills, or night sweats;  HEENT: Negative; No changes in vision or hearing, sinus congestion, difficulty swallowing Pulmonary: Negative; No cough, wheezing, shortness of breath, hemoptysis Cardiovascular: See HPI; positive for decline in exercise tolerance with exertional shortness of breath GI: Positive for GERD; No nausea, vomiting, diarrhea, or abdominal pain GU: Negative; No dysuria, hematuria, or difficulty voiding Musculoskeletal: Negative; no myalgias, joint pain, or weakness Hematologic/Oncology: Negative; no easy bruising, bleeding Endocrine: Negative; no heat/cold intolerance; no diabetes Neuro: Negative; no changes in balance, headaches Skin: Negative; No rashes or skin lesions Psychiatric: Positive for bipolar disorder, stable on medical therapy No behavioral problems, depression Sleep: Negative; No snoring, daytime sleepiness, hypersomnolence, bruxism, restless legs, hypnogognic hallucinations, no  cataplexy Other comprehensive 14 point system review is negative.   PE BP 129/82  Pulse 63  Ht 5\' 8"  (1.727 m)  Wt 195 lb (88.451 kg)  BMI 29.66 kg/m2  General: Alert, oriented, no distress.  Skin: normal turgor, no rashes HEENT: Normocephalic, atraumatic. Pupils round and reactive; sclera anicteric;no lid lag.  Nose without nasal septal hypertrophy Mouth/Parynx benign; Mallinpatti scale 3 Neck: No JVD, no carotid briuts Lungs: clear to ausculatation and percussion; no wheezing or rales Chest wall: no tenderness to palpitation Heart: RRR, s1 s2 normal 1/6 systolic murmur Abdomen: Mild central adiposity; soft, nontender; no hepatosplenomehaly,  BS+; abdominal aorta nontender and not dilated by palpation. Back: no CVA tenderness Pulses 2+ Extremities: no clubbing cyanosis or edema, Homan's sign negative  Neurologic: grossly nonfocal Psychologic: normal affect and mood.  ECG (independently read by me): Normal sinus rhythm at 63 beats per minute.  Small Q-wave in lead 3.  Normal intervals.  Prior December 2014 ECG: Sinus rhythm at 69 beats per minute. Normal intervals. No ectopy.  LABS:  BMET No results found for this basename: na,  k,  cl,  co2,  glucose,  bun,  creatinine,  calcium,  gfrnonaa,  gfraa     Hepatic Function Panel  No results found for this basename: prot,  albumin,  ast,  alt,  alkphos,  bilitot,  bilidir,  ibili     CBC    Component Value Date/Time   WBC 5.3 07/12/2011 1138   RBC 4.42* 07/12/2011 1138   HGB 11.7* 07/12/2011 1138   HCT 34.5* 07/12/2011 1138   MCV 78.0* 07/12/2011 1138   MCH 26.5* 07/12/2011 1138   MCHC 33.9 07/12/2011 1138     BNP No results found for this basename: probnp    Lipid Panel  No results found for this basename: chol,  trig,  hdl,  cholhdl,  vldl,  ldlcalc     RADIOLOGY: No results found.    ASSESSMENT AND PLAN:  Harold Newman is 8 years s/p two-vessel intervention to a subtotal LAD and chronic total occlusion of the  circumflex vessel with successful reperfusion. Subsequent nuclear studies have shown normal perfusion. His last study was in 2011. He is on combination therapy orders.  Previously demonstrated marked mixed hyperlipidemia.  He had been stable for many years without recurrent symptomatology.  However, recently has noticed a definite change with reduction in exercise tolerance and development of exertional shortness of breath.  There has been very mild episodes of chest discomfort.  A long discussion with him today.  We discussed subsequent evaluation with definitive repeat cardiac catheterization versus initial increase medical therapy and nuclear imaging.  His blood pressure today is controlled on his losartan 25 mg, Toprol-XL 37.5 mg, as well as amlodipine 5 mg.  He also has been on Crestor 40 mg in addition to Niaspan 1000 mg.  After much discussion, he would prefer an initial nuclear evaluation.  I'm increasing his Toprol to 50 mg  and starting him on Imdur 30 mg.  He will undergo nuclear perfusion study over the next several weeks.  I will see him back in the office for followup evaluation.  If there is a change from his prior study definitive repeat cardiac catheterization will be recommended.   Lennette Biharihomas A. Kelly, MD, [facility]  07/21/2013 8:28 AM

## 2013-07-26 NOTE — Telephone Encounter (Signed)
This encounter created in error.

## 2013-07-27 ENCOUNTER — Telehealth: Payer: Self-pay | Admitting: Cardiovascular Disease

## 2013-07-27 MED ORDER — ISOSORBIDE MONONITRATE ER 30 MG PO TB24: 15.0000 mg | ORAL_TABLET | Freq: Every day | ORAL | Status: DC

## 2013-07-27 MED ORDER — METOPROLOL SUCCINATE ER 25 MG PO TB24: ORAL_TABLET | ORAL | Status: DC

## 2013-07-27 NOTE — Telephone Encounter (Signed)
Please call,qyestion about his Isosorbide.

## 2013-07-27 NOTE — Telephone Encounter (Signed)
States he is still taking Toprol 25mg   qam and 12.5mg  qpm and only taking 15mg  of Isosorbide daily.  He has adjusted his meds because his BP was getting low 104/56 and he was afraid it would make him lightheaded.  He feels better on these current strengths and it keeps his BP at a more "normal" level.  Let patient know I will inform Dr. Tresa EndoKelly.  Patient voiced understanding.

## 2013-07-27 NOTE — Addendum Note (Signed)
Addended by: Ronnell GuadalajaraLASSITER, Vince Ainsley A on: 07/27/2013 01:05 PM   Modules accepted: Orders

## 2013-07-27 NOTE — Telephone Encounter (Signed)
Message to Dr. Tresa EndoKelly about med doses.

## 2013-08-02 ENCOUNTER — Telehealth: Payer: Self-pay | Admitting: Cardiovascular Disease

## 2013-08-02 NOTE — Telephone Encounter (Signed)
Returned patient's phone call. He informs me that on two separate occaisions he noticed some chest discomfort after taking his isosorbide. He says that he however does not want to stop it because he feels much better and his blood pressure is well controlled. Recommended that patient continue to monitor his symptoms. If they continue and/ or increase he is to call back for additional  Recommendations. Patient voiced his understanding and replied he thinks that he should just take it further apart in time from his other medications.

## 2013-08-02 NOTE — Telephone Encounter (Signed)
Returning [name]'s call

## 2013-08-02 NOTE — Telephone Encounter (Signed)
Having side effects from Isosorbide.  Having little chest discomfort when he takes. Please call

## 2013-08-02 NOTE — Telephone Encounter (Signed)
Left message for patient to return a call.

## 2013-08-11 ENCOUNTER — Encounter (HOSPITAL_COMMUNITY): Payer: Self-pay

## 2013-08-17 ENCOUNTER — Ambulatory Visit: Payer: Self-pay | Admitting: Cardiovascular Disease

## 2013-08-22 ENCOUNTER — Telehealth: Payer: Self-pay | Admitting: Cardiovascular Disease

## 2013-08-22 MED ORDER — ROSUVASTATIN CALCIUM 40 MG PO TABS: 20.0000 mg | ORAL_TABLET | Freq: Every day | ORAL | Status: DC

## 2013-08-22 NOTE — Telephone Encounter (Signed)
Patient wants samples of Crestor 20 mg

## 2013-08-22 NOTE — Telephone Encounter (Signed)
Samples of crestor 20 mg  3 packs avaliable Patient aware

## 2013-08-25 ENCOUNTER — Telehealth: Payer: Self-pay | Admitting: Cardiovascular Disease

## 2013-08-25 NOTE — Telephone Encounter (Signed)
4 pages of [facility], [facility].  Given to [place] in HIM for the patient's appointment on 8.13.15 w/ Dr. Kelly:djc

## 2013-09-07 ENCOUNTER — Ambulatory Visit: Payer: Self-pay | Admitting: Cardiovascular Disease

## 2013-09-11 ENCOUNTER — Telehealth: Payer: Self-pay | Admitting: Cardiovascular Disease

## 2013-09-11 NOTE — Telephone Encounter (Signed)
Returned call to patient no answer.LMTC. 

## 2013-09-11 NOTE — Telephone Encounter (Signed)
Harold Newman is calling because he discontinue the isosobide about 2wks agao , and have not been able to take it and have been doing fine without it .Have taking the metoprolol 50 mgs and have been doing good with that . His bp has been doing really well with the Metoprolol.    Thanks

## 2013-09-12 NOTE — Telephone Encounter (Signed)
Returning your call from yesterday. °

## 2013-09-15 NOTE — Telephone Encounter (Signed)
Returned call to patient no answer.LMTC. 

## 2013-09-15 NOTE — Telephone Encounter (Signed)
Received call from patient he stated he wanted to let Dr.Kelly know he had to stop Isosorbide.Stated caused low B/P and bad headaches.Stated he feels better since he stopped.Advised he needs follow up appointment with Dr.Kelly.Stated he has a new job and will call back to schedule.

## 2013-09-24 NOTE — Telephone Encounter (Signed)
acknowledged

## 2013-10-01 ENCOUNTER — Ambulatory Visit (INDEPENDENT_AMBULATORY_CARE_PROVIDER_SITE_OTHER): Payer: 59 | Admitting: Internal Medicine

## 2013-10-01 VITALS — BP 124/76 | HR 65 | Temp 97.6°F | Resp 15 | Ht 67.5 in | Wt 192.2 lb

## 2013-10-01 DIAGNOSIS — F319 Bipolar disorder, unspecified: Secondary | ICD-10-CM

## 2013-10-01 DIAGNOSIS — L259 Unspecified contact dermatitis, unspecified cause: Secondary | ICD-10-CM

## 2013-10-01 DIAGNOSIS — I2510 Atherosclerotic heart disease of native coronary artery without angina pectoris: Secondary | ICD-10-CM

## 2013-10-01 DIAGNOSIS — K922 Gastrointestinal hemorrhage, unspecified: Secondary | ICD-10-CM

## 2013-10-01 DIAGNOSIS — L309 Dermatitis, unspecified: Secondary | ICD-10-CM

## 2013-10-01 DIAGNOSIS — F3170 Bipolar disorder, currently in remission, most recent episode unspecified: Secondary | ICD-10-CM

## 2013-10-01 DIAGNOSIS — I2583 Coronary atherosclerosis due to lipid rich plaque: Secondary | ICD-10-CM

## 2013-10-01 DIAGNOSIS — I10 Essential (primary) hypertension: Secondary | ICD-10-CM

## 2013-10-01 NOTE — Progress Notes (Signed)
He has been forced by change insurance to come to get referrals to specialists that delivered most of his care over the last 10 years Patient Active Problem List   Diagnosis Date Noted  . Eczema--[facility] 10/01/2013  . Bleeding gastrointestinal--[facility]--thought 2 to plavis post stents 10/01/2013  . Exertional shortness of breath 07/21/2013  . CAD (coronary artery disease)s/p stent x2--kelly 01/12/2013  . Hyperlipidemia with target LDL less than 70---kelly 01/12/2013  . Bipolar disorder--[facility] 01/12/2013  . Essential hypertension--kelly 01/12/2013   Stable now Prior to Admission medications   Medication Sig Start Date End Date Taking? Authorizing Provider  amLODipine (NORVASC) 5 MG tablet Take 1 tablet (5 mg total) by mouth daily. 06/09/13  Yes Lennette Bihari, MD  aspirin 325 MG tablet Take 325 mg by mouth daily.   Yes Historical Provider, MD  co-enzyme Q-10 30 MG capsule Take 200 mg by mouth daily.   Yes Historical Provider, MD  Cyanocobalamin (VITAMIN B-12 PO) Take by mouth daily. 5000 i.u.   Yes Historical Provider, MD  D-Ribose POWD 5 g by Does not apply route daily.   Yes Historical Provider, MD  lansoprazole (PREVACID) 15 MG capsule Take 30 mg by mouth daily.    Yes Historical Provider, MD  LevOCARNitine L-Tartrate (L-CARNITINE) 500 MG CAPS Take 500 mg by mouth daily.   Yes Historical Provider, MD  lithium carbonate (ESKALITH) 450 MG CR tablet Take 675 mg by mouth daily.   Yes Historical Provider, MD  LORazepam (ATIVAN) 1 MG tablet Take 1 mg by mouth as needed.    Yes Historical Provider, MD  losartan (COZAAR) 25 MG tablet Take 1 tablet (25 mg total) by mouth daily. 07/20/13  Yes Lennette Bihari, MD  metoprolol succinate (TOPROL-XL) 25 MG 24 hr tablet Take 1 tablet in the morning and 1/2 tablet in the PM.  Take with or immediately following a meal. 07/27/13  Yes Lennette Bihari, MD  Multiple Vitamins-Minerals (MULTIVITAMIN WITH MINERALS) tablet Take 1 tablet by mouth daily.   Yes  Historical Provider, MD  NAFTIN 2 % CREA Apply 1 application topically as needed. 06/02/13  Yes Historical Provider, MD  niacin (NIASPAN) 1000 MG CR tablet Take 1,000 mg by mouth at bedtime.   Yes Historical Provider, MD  nitroGLYCERIN (NITROSTAT) 0.4 MG SL tablet Place 1 tablet (0.4 mg total) under the tongue every 5 (five) minutes as needed for chest pain. 07/06/13  Yes Lennette Bihari, MD  rosuvastatin (CRESTOR) 40 MG tablet Take 0.5 tablets (20 mg total) by mouth daily. 08/22/13  Yes Lennette Bihari, MD  dicyclomine (BENTYL) 10 MG capsule Take 10 mg by mouth as needed. 05/28/11 05/27/12  Dois Davenport, MD   Labs in chart  ROS-stable  Exam BP 124/76  Pulse 65  Temp(Src) 97.6 F (36.4 C) (Oral)  Resp 15  Ht 5' 7.5" (1.715 m)  Wt 192 lb 3.2 oz (87.181 kg)  BMI 29.64 kg/m2  SpO2 98% HEENT clear Heart regular No respiratory distress Extremities clear Gait normal Mood good/affect appropriate/thought content normal  IMP Bipolar affective disorder in remission - Plan: Ambulatory referral to Psychiatry  Coronary artery disease s/p stents x 2 - Plan: Ambulatory referral to Cardiology  Essential hypertension - Plan: Ambulatory referral to Cardiology  Eczema - Plan: Ambulatory referral to Dermatology  Gastrointestinal hemorrhage,secondary to plavix - Plan: Ambulatory referral to Gastroenterology  Labs just done by cards/[facility]A at [facility] ok PSA <1.00  immun UTD  Plan  F/u as needed

## 2013-10-10 ENCOUNTER — Telehealth: Payer: Self-pay | Admitting: Cardiovascular Disease

## 2013-10-10 MED ORDER — METOPROLOL SUCCINATE ER 50 MG PO TB24: 50.0000 mg | ORAL_TABLET | Freq: Every day | ORAL | Status: DC

## 2013-10-10 NOTE — Telephone Encounter (Signed)
Left patient a message to call back, last telephone encounter from July 2015 stated that patient was taking  Q AM and 12.5 Q PM. Need to clarify with patient

## 2013-10-10 NOTE — Telephone Encounter (Signed)
Rx refill printed and signed by Corine Shelter PA-C. Rx was then put in outgoing mail. I let patient know it may take a bit longer than the usual mailing system, patient voiced understanding and stated he has a while before he needs it.

## 2013-10-10 NOTE — Telephone Encounter (Signed)
Pt need a new prescription mailed to him for Toprol XL 50 #30 and 3 refills please.

## 2013-11-10 ENCOUNTER — Telehealth: Payer: Self-pay | Admitting: Cardiovascular Disease

## 2013-11-10 NOTE — Telephone Encounter (Signed)
LVM notifying patient we do not currently have samples of Crestor 20.

## 2013-11-10 NOTE — Telephone Encounter (Signed)
Harold Newman is asking if he can get samples of 20mg  Crestor. Please Call   Thanks

## 2014-01-09 ENCOUNTER — Telehealth: Payer: Self-pay | Admitting: Cardiovascular Disease

## 2014-01-09 NOTE — Telephone Encounter (Signed)
Left message for pt to call, ? What pharmacy.

## 2014-01-09 NOTE — Telephone Encounter (Signed)
Pt called in wanting to get a refill on his: Crestor Amlodipine Losartan Toprol XL  Thanks

## 2014-01-10 ENCOUNTER — Telehealth: Payer: Self-pay | Admitting: Cardiovascular Disease

## 2014-01-10 MED ORDER — LOSARTAN POTASSIUM 25 MG PO TABS: 25.0000 mg | ORAL_TABLET | Freq: Every day | ORAL | Status: DC

## 2014-01-10 MED ORDER — ROSUVASTATIN CALCIUM 40 MG PO TABS: 20.0000 mg | ORAL_TABLET | Freq: Every day | ORAL | Status: DC

## 2014-01-10 MED ORDER — AMLODIPINE BESYLATE 5 MG PO TABS: 5.0000 mg | ORAL_TABLET | Freq: Every day | ORAL | Status: DC

## 2014-01-10 MED ORDER — METOPROLOL SUCCINATE ER 50 MG PO TB24: 50.0000 mg | ORAL_TABLET | Freq: Every day | ORAL | Status: DC

## 2014-01-10 NOTE — Telephone Encounter (Signed)
Please mail his prescriptions to him.He said it was a miscommunication,he did not want them called in.

## 2014-01-10 NOTE — Telephone Encounter (Signed)
Rx's printed and will mail after Dr. Tresa EndoKelly signs tomorrow.

## 2014-02-08 ENCOUNTER — Telehealth: Payer: Self-pay | Admitting: Cardiovascular Disease

## 2014-02-08 MED ORDER — ROSUVASTATIN CALCIUM 20 MG PO TABS: 20.0000 mg | ORAL_TABLET | Freq: Every day | ORAL | Status: DC

## 2014-02-08 NOTE — Telephone Encounter (Signed)
Samples at front. Left VM on pt phone.

## 2014-02-08 NOTE — Telephone Encounter (Signed)
Pr would like samples of Crestor 20 mg please.

## 2014-04-05 ENCOUNTER — Telehealth: Payer: Self-pay | Admitting: Cardiovascular Disease

## 2014-04-05 NOTE — Telephone Encounter (Signed)
Pt called in wanting some samples of Crestor 20mg . Please f/u   Thanks

## 2014-04-05 NOTE — Telephone Encounter (Signed)
LEFT MESSAGE ON VOICEMAIL --NO SAMPLES OF CRESTOR AVAILABLE

## 2014-04-20 ENCOUNTER — Telehealth: Payer: Self-pay

## 2014-04-20 DIAGNOSIS — L309 Dermatitis, unspecified: Secondary | ICD-10-CM

## 2014-04-20 DIAGNOSIS — K922 Gastrointestinal hemorrhage, unspecified: Secondary | ICD-10-CM

## 2014-04-20 NOTE — Telephone Encounter (Signed)
Patient left a message with referrals requesting Dr Merla Richesdoolittle to [facility]. Per patient our office had referred him prior but his insurance will requires a new referral. I tried contacting back no answer and his voicemail box was full. Patients call back number is [phone]

## 2014-04-20 NOTE — Telephone Encounter (Signed)
Ok to refer.

## 2014-04-21 NOTE — Telephone Encounter (Signed)
Done

## 2014-04-23 NOTE — Telephone Encounter (Signed)
[facility]

## 2014-05-07 ENCOUNTER — Other Ambulatory Visit: Payer: Self-pay | Admitting: Cardiovascular Disease

## 2014-05-07 NOTE — Telephone Encounter (Signed)
Patient made aware that we do not have samples and that medication is going generic in May. Patient stated he has enough medication to last him until it goes generic.

## 2014-05-07 NOTE — Telephone Encounter (Signed)
Pr would like some samples of Crestor please.

## 2014-06-12 ENCOUNTER — Telehealth: Payer: Self-pay

## 2014-06-12 MED ORDER — ROSUVASTATIN CALCIUM 40 MG PO TABS: 20.0000 mg | ORAL_TABLET | Freq: Every day | ORAL | Status: DC

## 2014-06-12 NOTE — Telephone Encounter (Signed)
Crestor refill sent to pharmacy.[facility]

## 2014-07-01 ENCOUNTER — Encounter: Payer: Self-pay | Admitting: *Deleted

## 2014-07-01 ENCOUNTER — Ambulatory Visit (INDEPENDENT_AMBULATORY_CARE_PROVIDER_SITE_OTHER): Payer: 59 | Admitting: Internal Medicine

## 2014-07-01 VITALS — BP 108/70 | HR 74 | Temp 97.4°F | Resp 16 | Ht 67.0 in | Wt 193.1 lb

## 2014-07-01 DIAGNOSIS — I2583 Coronary atherosclerosis due to lipid rich plaque: Principal | ICD-10-CM

## 2014-07-01 DIAGNOSIS — J4520 Mild intermittent asthma, uncomplicated: Secondary | ICD-10-CM | POA: Diagnosis not present

## 2014-07-01 DIAGNOSIS — J988 Other specified respiratory disorders: Secondary | ICD-10-CM

## 2014-07-01 DIAGNOSIS — I10 Essential (primary) hypertension: Secondary | ICD-10-CM | POA: Diagnosis not present

## 2014-07-01 DIAGNOSIS — I2510 Atherosclerotic heart disease of native coronary artery without angina pectoris: Secondary | ICD-10-CM | POA: Diagnosis not present

## 2014-07-01 DIAGNOSIS — E785 Hyperlipidemia, unspecified: Secondary | ICD-10-CM | POA: Diagnosis not present

## 2014-07-01 DIAGNOSIS — J22 Unspecified acute lower respiratory infection: Secondary | ICD-10-CM

## 2014-07-01 LAB — LIPID PANEL
Cholesterol: 123 mg/dL (ref 0–200)
HDL: 20 mg/dL — AB (ref >=40)
LDL CALC: 75 mg/dL (ref 0–99)
TRIGLYCERIDES: 142 mg/dL (ref <150)
Total CHOL/HDL Ratio: 6.2 Ratio
VLDL: 28 mg/dL (ref 0–40)

## 2014-07-01 LAB — POCT CBC
Granulocyte percent: 77.8 %G (ref 37–80)
HEMATOCRIT: 47.7 % (ref 43.5–53.7)
HEMOGLOBIN: 15.9 g/dL (ref 14.1–18.1)
Lymph, poc: 1.9 (ref 0.6–3.4)
MCH, POC: 29 pg (ref 27–31.2)
MCHC: 33.2 g/dL (ref 31.8–35.4)
MCV: 87.2 fL (ref 80–97)
MID (CBC): 0.5 (ref 0–0.9)
MPV: 8.6 fL (ref 0–99.8)
POC Granulocyte: 8.5 — AB (ref 2–6.9)
POC LYMPH PERCENT: 17.8 %L (ref 10–50)
POC MID %: 4.4 %M (ref 0–12)
Platelet Count, POC: 173 10*3/uL (ref 142–424)
RBC: 5.47 M/uL (ref 4.69–6.13)
RDW, POC: 14 %
WBC: 10.9 10*3/uL — AB (ref 4.6–10.2)

## 2014-07-01 LAB — COMPREHENSIVE METABOLIC PANEL
ALK PHOS: 60 U/L (ref 39–117)
ALT: 19 U/L (ref 0–53)
AST: 23 U/L (ref 0–37)
Albumin: 4.5 g/dL (ref 3.5–5.2)
BILIRUBIN TOTAL: 0.9 mg/dL (ref 0.2–1.2)
BUN: 11 mg/dL (ref 6–23)
CALCIUM: 9.4 mg/dL (ref 8.4–10.5)
CHLORIDE: 98 meq/L (ref 96–112)
CO2: 23 meq/L (ref 19–32)
CREATININE: 0.8 mg/dL (ref 0.50–1.35)
Glucose, Bld: 92 mg/dL (ref 70–99)
Potassium: 4.5 mEq/L (ref 3.5–5.3)
Sodium: 132 mEq/L — ABNORMAL LOW (ref 135–145)
Total Protein: 6.8 g/dL (ref 6.0–8.3)

## 2014-07-01 MED ORDER — AZITHROMYCIN 250 MG PO TABS: ORAL_TABLET | ORAL | Status: DC

## 2014-07-01 MED ORDER — ROSUVASTATIN CALCIUM 40 MG PO TABS: 20.0000 mg | ORAL_TABLET | Freq: Every day | ORAL | Status: DC

## 2014-07-01 MED ORDER — ALBUTEROL SULFATE HFA 108 (90 BASE) MCG/ACT IN AERS: 2.0000 | INHALATION_SPRAY | Freq: Four times a day (QID) | RESPIRATORY_TRACT | Status: DC | PRN

## 2014-07-01 MED ORDER — ROSUVASTATIN CALCIUM 40 MG PO TABS: 40.0000 mg | ORAL_TABLET | Freq: Every day | ORAL | Status: DC

## 2014-07-01 MED ORDER — AMLODIPINE BESYLATE 5 MG PO TABS: 5.0000 mg | ORAL_TABLET | Freq: Every day | ORAL | Status: DC

## 2014-07-01 MED ORDER — METOPROLOL SUCCINATE ER 50 MG PO TB24: 50.0000 mg | ORAL_TABLET | Freq: Every day | ORAL | Status: DC

## 2014-07-01 NOTE — Progress Notes (Signed)
Subjective:  This chart was scribed for Harold Sia, MD by Harold Newman, ED Scribe. This patient was seen in room 8 and the patient's care was started at 2:00 PM.  Patient ID: Harold Newman, male    DOB: 1955-04-01, 59 y.o.   MRN: [id number]  HPI   Chief Complaint  Patient presents with  . Cough    with chest congestion  . Sinus Drainage  . Fatigue   HPI Comments: Harold Newman is a 59 y.o. male who presents to the [facility] complaining of a persistent, productive cough onset 8 days. He reports intermittent nasal congestion, chest congestion, wheezing and improved otalgia. He denies worsening cough at night with sleep. Pt has taken delsym with minimal relief. Pt denies fever, nausea, emesis, and SOB with movement.   Pt is requesting refills of Crestor , Norvasc , and Toprol 50 mg. Has f/u Dr Tresa Endo 1 mo No labs since last spring No sxt  Patient Active Problem List   Diagnosis Date Noted  . Eczema 10/01/2013  . Bleeding gastrointestinal 10/01/2013  . Exertional shortness of breath 07/21/2013  . CAD (coronary artery disease) 01/12/2013  . Hyperlipidemia with target LDL less than 70 01/12/2013  . Bipolar disorder 01/12/2013  . Essential hypertension 01/12/2013   Past Medical History  Diagnosis Date  . Bipolar disorder   . CAD (coronary artery disease)   . Hypertension   . Hyperlipidemia    Past Surgical History  Procedure Laterality Date  . Appendectomy  10/2000  . Transthoracic echocardiogram  2007    mild MR & TR, AV mildly sclerotic  . Nm myocar perf wall motion  2011    bruce myoview - normal pattern of perfusion, low risk  . Cardiac catheterization  03/17/2005    low-normal LV function, 2 vessel CAD (subtotal occlusion of LAD, stenosis of small 2nd diagonal and with mid LAD narrowing, total occlusion of distal Cfx); PTCA and stent of LAD and 2nd diagonal with 3.5x23mm Cypher DES (Dr. Nicki Guadalajara)  . Cardiac catheterization  04/14/2005      2.5x73mm Cypher DES to L Cfx (Dr. Nicki Guadalajara)  . Colonoscopy  08/2011   Allergies  Allergen Reactions  . Anticoagulant Compound     PLAVIX: Other reaction(s): Other (See Comments) "Bleeding issues"   Prior to Admission medications   Medication Sig Start Date End Date Taking? Authorizing Provider  amLODipine (NORVASC) 5 MG tablet Take 1 tablet (5 mg total) by mouth daily. 01/10/14  Yes Lennette Bihari, MD  aspirin 325 MG tablet Take 325 mg by mouth daily.   Yes Historical Provider, MD  co-enzyme Q-10 30 MG capsule Take 200 mg by mouth daily.   Yes Historical Provider, MD  Cyanocobalamin (VITAMIN B-12 PO) Take by mouth daily. 5000 i.u.   Yes Historical Provider, MD  D-Ribose POWD 5 g by Does not apply route daily.   Yes Historical Provider, MD  dicyclomine (BENTYL) 10 MG capsule Take 10 mg by mouth as needed. 05/28/11  Yes Dois Davenport, MD  lansoprazole (PREVACID) 15 MG capsule Take 30 mg by mouth daily.    Yes Historical Provider, MD  LevOCARNitine L-Tartrate (L-CARNITINE) 500 MG CAPS Take 500 mg by mouth daily.   Yes Historical Provider, MD  lithium carbonate (ESKALITH) 450 MG CR tablet Take 675 mg by mouth daily.   Yes Historical Provider, MD  LORazepam (ATIVAN) 1 MG tablet Take 1 mg by mouth as needed.    Yes  Historical Provider, MD  losartan (COZAAR) 25 MG tablet Take 1 tablet (25 mg total) by mouth daily. 01/10/14  Yes Lennette Bihari, MD  metoprolol succinate (TOPROL XL) 50 MG 24 hr tablet Take 1 tablet (50 mg total) by mouth daily. Take with or immediately following a meal. 01/10/14  Yes Lennette Bihari, MD  Multiple Vitamins-Minerals (MULTIVITAMIN WITH MINERALS) tablet Take 1 tablet by mouth daily.   Yes Historical Provider, MD  NAFTIN 2 % CREA Apply 1 application topically as needed. 06/02/13  Yes Historical Provider, MD  nitroGLYCERIN (NITROSTAT) 0.4 MG SL tablet Place 1 tablet (0.4 mg total) under the tongue every 5 (five) minutes as needed for chest pain. 07/06/13  Yes Lennette Bihari, MD  rosuvastatin (CRESTOR) 40 MG tablet Take 0.5 tablets (20 mg total) by mouth daily. 06/12/14  Yes Lennette Bihari, MD   Review of Systems  Constitutional: Negative for fever, chills and unexpected weight change.  HENT: Positive for congestion and ear pain ( improved). Negative for sinus pressure, sore throat and trouble swallowing.   Eyes: Negative for visual disturbance.  Respiratory: Positive for cough. Negative for shortness of breath.   Cardiovascular: Negative for chest pain, palpitations and leg swelling.  Gastrointestinal: Negative for nausea, vomiting and abdominal pain.  Genitourinary: Negative for difficulty urinating.  Neurological: Negative for headaches.   Objective:   Physical Exam  Constitutional: He is oriented to person, place, and time. He appears well-developed and well-nourished. No distress.  HENT:  Head: Normocephalic and atraumatic.  Right Ear: External ear normal.  Left Ear: External ear normal.  Mouth/Throat: Oropharynx is clear and moist. No posterior oropharyngeal erythema.  rhinorrhea  Eyes: Conjunctivae and EOM are normal. Pupils are equal, round, and reactive to light.  Neck: Neck supple. No thyromegaly present.  Cardiovascular: Normal rate, regular rhythm and normal heart sounds.   No murmur heard. Pulmonary/Chest: Effort normal. He has wheezes.  Wheezing on forced expiration bilat No rales or rhonchi  Musculoskeletal: Normal range of motion. He exhibits no edema.  Lymphadenopathy:    He has no cervical adenopathy.  Neurological: He is alert and oriented to person, place, and time. No cranial nerve deficit.  Skin: Skin is warm and dry.  Psychiatric: He has a normal mood and affect. His behavior is normal.  Nursing note and vitals reviewed.  Filed Vitals:   07/01/14 1325  BP: 108/70  Pulse: 74  Temp: 97.4 F (36.3 C)  TempSrc: Oral  Resp: 16  Height:  (1.702 m)  Weight: 193 lb 2 oz (87.601 kg)  SpO2: 98%   Assessment & Plan:     1. Coronary artery disease due to lipid rich plaque   2. Hyperlipidemia with target LDL less than 70   3. Essential hypertension   4. Lower respiratory infection   5. Reactive airway disease, mild intermittent, uncomplicated     Meds ordered this encounter  Medications  . metoprolol succinate (TOPROL XL) 50 MG 24 hr tablet    Sig: Take 1 tablet (50 mg total) by mouth daily. Take with or immediately following a meal.    Dispense:  90 tablet    Refill:  1  . amLODipine (NORVASC) 5 MG tablet    Sig: Take 1 tablet (5 mg total) by mouth daily.    Dispense:  90 tablet    Refill:  1  . rosuvastatin (CRESTOR) 40 MG tablet    Sig: Take 0.5 tablets (20 mg total) by mouth daily.  Dispense:  90 tablet    Refill:  1  . azithromycin (ZITHROMAX) 250 MG tablet    Sig: As packaged    Dispense:  6 tablet    Refill:  0  . albuterol (PROVENTIL HFA;VENTOLIN HFA) 108 (90 BASE) MCG/ACT inhaler    Sig: Inhale 2 puffs into the lungs every 6 (six) hours as needed for wheezing or shortness of breath.    Dispense:  1 Inhaler    Refill:  0   Ck labs before f/u w Dr Kirtland BouchardK next mo  I have completed the patient encounter in its entirety as documented by the scribe, with editing by me where necessary. Jerry Haugen P. Merla Richesoolittle, M.D.

## 2014-07-02 ENCOUNTER — Telehealth: Payer: Self-pay

## 2014-07-02 NOTE — Telephone Encounter (Signed)
Pt LM on lab VM wanting to know if we can release his results to MyChart. Can you do this please. Thanks

## 2014-07-03 NOTE — Telephone Encounter (Signed)
This has been done.

## 2014-07-04 ENCOUNTER — Other Ambulatory Visit: Payer: Self-pay

## 2014-07-04 MED ORDER — ROSUVASTATIN CALCIUM 40 MG PO TABS: 40.0000 mg | ORAL_TABLET | Freq: Every day | ORAL | Status: DC

## 2014-07-04 NOTE — Telephone Encounter (Signed)
Rx(s) sent to pharmacy electronically.  

## 2014-07-08 ENCOUNTER — Telehealth: Payer: Self-pay

## 2014-07-20 ENCOUNTER — Ambulatory Visit (INDEPENDENT_AMBULATORY_CARE_PROVIDER_SITE_OTHER): Payer: 59 | Admitting: Cardiovascular Disease

## 2014-07-20 ENCOUNTER — Encounter: Payer: Self-pay | Admitting: Cardiovascular Disease

## 2014-07-20 VITALS — BP 116/76 | HR 69 | Ht 68.0 in | Wt 195.0 lb

## 2014-07-20 DIAGNOSIS — Z79899 Other long term (current) drug therapy: Secondary | ICD-10-CM | POA: Diagnosis not present

## 2014-07-20 DIAGNOSIS — I25810 Atherosclerosis of coronary artery bypass graft(s) without angina pectoris: Secondary | ICD-10-CM

## 2014-07-20 DIAGNOSIS — I10 Essential (primary) hypertension: Secondary | ICD-10-CM | POA: Diagnosis not present

## 2014-07-20 DIAGNOSIS — E785 Hyperlipidemia, unspecified: Secondary | ICD-10-CM | POA: Diagnosis not present

## 2014-07-20 MED ORDER — NIACIN ER (ANTIHYPERLIPIDEMIC) 500 MG PO TBCR: 500.0000 mg | EXTENDED_RELEASE_TABLET | Freq: Every day | ORAL | Status: DC

## 2014-07-20 NOTE — Patient Instructions (Addendum)
Your physician has requested that you have en exercise stress myoview and labwork in 6 months prior to your next visit.  Start prescription for Niaspan. This has already been sent to your pharmacy.  Your physician wants you to follow-up in: 6 months or sooner if needed. You will receive a reminder letter in the mail two months in advance. If you don't receive a letter, please call our office to schedule the follow-up appointment.

## 2014-07-20 NOTE — Progress Notes (Signed)
Patient ID: Harold Newman, male   DOB: 09-Sep-1955, 59 y.o.   MRN: [id number]      HPI: Harold Newman is a 59 y.o. male who presents to the office today for a one-year follow-up cardiology evaluation.    Harold Newman has a history of significant hyperlipidemia with markedly elevated small LDL small particles in the past greater than 3000.  In 2007 he was found to have subtotal LAD stenosis as well as total occlusion of the circumflex vessel with collaterals after a nuclear study demonstrated multiple areas of ischemia. He underwent initial intervention to his LAD totally and underwent successful staged intervention to his chronically occluded circumflex vessel with reestablishment of antegrade flow. His last nuclear perfusion study in 2011 continued to show fairly normal perfusion without scar or ischemia.  He has done exceptionally well since that intervention.  However, in February 2015, he noticed a change in symptomatology with a decline in exercise tolerance with exertional dyspnea and also has noticed some mild episodes of chest pain, which is seen to be mild, but similar in quality to his prior discomfort.  I had seen him last year and recommended a subsequent nuclear perfusion study.  He never had this done.  He did have some issues with Plavix in the past causing GI bleeding issues.  He now has been taking nattokinase which he states has alleviated a lot of his symptomatology.  He denies any overt recent GI bleed.  He recently had a URI infection which ultimately cleared after 46 weeks.  During this time, he was not using his regularly he has had in the past.  He denies exertional chest pain.  He is unaware of palpitations.  He has been taking Crestor 40 mg but stopped his niacin.  He recently had laboratory which revealed a total cholesterol of 123, but his HDL was very low at 20, and his calculated LDL was 75.  In the past.  He was noted to have markedly elevated small LDL particles in the setting  of very low HDL, so I suspect his particle number may still be elevated despite his LDL of 75.  Recent triglycerides 142.  Past Medical History  Diagnosis Date  . Bipolar disorder   . CAD (coronary artery disease)   . Hypertension   . Hyperlipidemia     Past Surgical History  Procedure Laterality Date  . Appendectomy  10/2000  . Transthoracic echocardiogram  2007    mild MR & TR, AV mildly sclerotic  . Nm myocar perf wall motion  2011    bruce myoview - normal pattern of perfusion, low risk  . Cardiac catheterization  03/17/2005    low-normal LV function, 2 vessel CAD (subtotal occlusion of LAD, stenosis of small 2nd diagonal and with mid LAD narrowing, total occlusion of distal Cfx); PTCA and stent of LAD and 2nd diagonal with 3.5x74mm Cypher DES (Dr. Shelva Majestic)  . Cardiac catheterization  04/14/2005    2.5x20mm Cypher DES to L Cfx (Dr. Shelva Majestic)  . Colonoscopy  08/2011    Allergies  Allergen Reactions  . Anticoagulant Compound     PLAVIX: Other reaction(s): Other (See Comments) "Bleeding issues"    Current Outpatient Prescriptions  Medication Sig Dispense Refill  . amLODipine (NORVASC) 5 MG tablet Take 1 tablet (5 mg total) by mouth daily. 90 tablet 1  . aspirin 325 MG tablet Take 325 mg by mouth daily.    Marland Kitchen co-enzyme Q-10 30 MG capsule Take 200 mg  by mouth daily.    . Cyanocobalamin (VITAMIN B-12 PO) Take by mouth daily. 5000 i.u.    Marland Kitchen D-Ribose POWD 5 g by Does not apply route daily.    Marland Kitchen dicyclomine (BENTYL) 10 MG capsule Take 10 mg by mouth as needed.    . lansoprazole (PREVACID) 15 MG capsule Take 30 mg by mouth daily.     . LevOCARNitine L-Tartrate (L-CARNITINE) 500 MG CAPS Take 500 mg by mouth daily.    Marland Kitchen lithium carbonate (ESKALITH) 450 MG CR tablet Take 675 mg by mouth daily.    Marland Kitchen LORazepam (ATIVAN) 1 MG tablet Take 1 mg by mouth as needed.     Marland Kitchen losartan (COZAAR) 25 MG tablet Take 1 tablet (25 mg total) by mouth daily. 90 tablet 2  . metoprolol succinate  (TOPROL XL) 50 MG 24 hr tablet Take 1 tablet (50 mg total) by mouth daily. Take with or immediately following a meal. 90 tablet 1  . Multiple Vitamins-Minerals (MULTIVITAMIN WITH MINERALS) tablet Take 1 tablet by mouth daily.    Marland Kitchen NAFTIN 2 % CREA Apply 1 application topically as needed.    . nitroGLYCERIN (NITROSTAT) 0.4 MG SL tablet Place 1 tablet (0.4 mg total) under the tongue every 5 (five) minutes as needed for chest pain. 25 tablet 2  . rosuvastatin (CRESTOR) 40 MG tablet Take 1 tablet (40 mg total) by mouth daily. Patient needs to call our office to schedule an appointment for future refills. 90 tablet 0  . niacin (NIASPAN) 500 MG CR tablet Take 1 tablet (500 mg total) by mouth at bedtime. 60 tablet 11   No current facility-administered medications for this visit.    History   Social History  . Marital Status: Married    Spouse Name: N/A  . Number of Children: 3  . Years of Education: 16   Occupational History  . Not on file.   Social History Main Topics  . Smoking status: Never Smoker   . Smokeless tobacco: Never Used  . Alcohol Use: Not on file  . Drug Use: Not on file  . Sexual Activity: Not on file   Other Topics Concern  . Not on file   Social History Narrative   Socially, he is now into a having been divorced for many years. He has 3 children 2 grandchildren. No tobacco or alcohol use.  Family History  Problem Relation Age of Onset  . Aplastic anemia Mother   . Hypertension Mother   . Heart failure Father   . Heart attack Maternal Grandfather   . Hypertension Brother   . Parkinson's disease Brother   . Brain cancer Brother     tumor    ROS General: Negative; No fevers, chills, or night sweats;  HEENT: Negative; No changes in vision or hearing, sinus congestion, difficulty swallowing Pulmonary: Negative; No cough, wheezing, shortness of breath, hemoptysis Cardiovascular: See HPI;  GI: Positive for GERD; No nausea, vomiting, diarrhea, or abdominal  pain GU: Negative; No dysuria, hematuria, or difficulty voiding Musculoskeletal: Negative; no myalgias, joint pain, or weakness Hematologic/Oncology: Negative; no easy bruising, bleeding Endocrine: Negative; no heat/cold intolerance; no diabetes Neuro: Negative; no changes in balance, headaches Skin: Negative; No rashes or skin lesions Psychiatric: Positive for bipolar disorder, stable on medical therapy No behavioral problems, depression Sleep: Negative; No snoring, daytime sleepiness, hypersomnolence, bruxism, restless legs, hypnogognic hallucinations, no cataplexy Other comprehensive 14 point system review is negative.   PE BP 116/76 mmHg  Pulse 69  Ht 5'  8" (1.727 m)  Wt 195 lb (88.451 kg)  BMI 29.66 kg/m2   Wt Readings from Last 3 Encounters:  07/20/14 195 lb (88.451 kg)  07/01/14 193 lb 2 oz (87.601 kg)  10/01/13 192 lb 3.2 oz (87.181 kg)   General: Alert, oriented, no distress.  Skin: normal turgor, no rashes HEENT: Normocephalic, atraumatic. Pupils round and reactive; sclera anicteric;no lid lag.  Nose without nasal septal hypertrophy Mouth/Parynx benign; Mallinpatti scale 3 Neck: No JVD, no carotid bruits with normal carotid upstroke Lungs: clear to ausculatation and percussion; no wheezing or rales Chest wall: no tenderness to palpitation Heart: RRR, s1 s2 normal 1/6 systolic murmur; no diastolic or.  No S3 or S4 gallop.  No rubs thrills or heaves. Abdomen: Mild central adiposity; soft, nontender; no hepatosplenomehaly, BS+; abdominal aorta nontender and not dilated by palpation. Back: no CVA tenderness Pulses 2+ Extremities: no clubbing cyanosis or edema, Homan's sign negative  Neurologic: grossly nonfocal Psychologic: normal affect and mood.  ECG (independently read by me): Normal sinus rhythm at 69 bpm.  No ectopy.  Normal intervals.  No significant ST segment changes  June 2015 ECG (independently read by me): Normal sinus rhythm at 63 beats per minute.  Small  Q-wave in lead 3.  Normal intervals.  Prior December 2014 ECG: Sinus rhythm at 69 beats per minute. Normal intervals. No ectopy.  LABS: BMP Latest Ref Rng 07/01/2014  Glucose 70 - 99 mg/dL 92  BUN 6 - 23 mg/dL 11  Creatinine 0.50 - 1.35 mg/dL 0.80  Sodium 135 - 145 mEq/L 132(L)  Potassium 3.5 - 5.3 mEq/L 4.5  Chloride 96 - 112 mEq/L 98  CO2 19 - 32 mEq/L 23  Calcium 8.4 - 10.5 mg/dL 9.4   Hepatic Function Latest Ref Rng 07/01/2014  Total Protein 6.0 - 8.3 g/dL 6.8  Albumin 3.5 - 5.2 g/dL 4.5  AST 0 - 37 U/L 23  ALT 0 - 53 U/L 19  Alk Phosphatase 39 - 117 U/L 60  Total Bilirubin 0.2 - 1.2 mg/dL 0.9   CBC Latest Ref Rng 07/01/2014 07/12/2011 05/21/2011  WBC 4.6 - 10.2 K/uL 10.9(A) 5.3 6.8  Hemoglobin 14.1 - 18.1 g/dL 15.9 11.7(A) 11.5(A)  Hematocrit 43.5 - 53.7 % 47.7 34.5(A) 36.9(A)   Lab Results  Component Value Date   MCV 87.2 07/01/2014   MCV 78.0* 07/12/2011   MCV 88.6 05/21/2011   No results found for: TSH   Lipid Panel     Component Value Date/Time   CHOL 123 07/01/2014 1430   TRIG 142 07/01/2014 1430   HDL 20* 07/01/2014 1430   CHOLHDL 6.2 07/01/2014 1430   VLDL 28 07/01/2014 1430   LDLCALC 75 07/01/2014 1430   RADIOLOGY: No results found.    ASSESSMENT AND PLAN: Mr. 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 Subsequent nuclear studies have shown normal perfusion. His last study was in 2011.  When I saw him last year, I titrated his Toprol to 50 mg and had recommended a subsequent stress test which he never had.  His symptomatology resolved.  Presently, he denies exertional shortness of breath.  He has been taking sulfa prescribed nattokinase and believes that this has significantly improved his symptoms.  I reviewed his recent blood work.  He has a history of significant mixed hyperlipidemia with a significant preponderance  of small LDL sub-particles greater than 3000.  On his initial evaluation.  He continues to take Crestor.  I have recommended resumption of at least low-dose niacin at 500 mg.  His body mass index is 29.66.  I have discussed weight loss and increased exercise.  His blood pressure today is controlled on amlodipine 5 mg, losartan 25 mg in addition to his Toprol 50 mg.  He's not having any arrhythmia.  He continues to be on medication for his bipolar disorder and denies any recent episodes.  In 6 months, I am recommending that he undergo a five-year follow-up nuclear perfusion study and at that time, it will be almost 10 years since his two-vessel coronary intervention.  I will see him back in the office in follow-up of this nuclear stress test, and prior to that office visit a complete set of blood work will be obtained including an NMR profile to better evaluate his mixed hyperlipidemia prior to that office visit.  Troy Sine, MD, [facility]  07/20/2014 6:45 PM

## 2014-08-17 NOTE — Telephone Encounter (Signed)
Close encounter 

## 2014-09-16 ENCOUNTER — Ambulatory Visit (INDEPENDENT_AMBULATORY_CARE_PROVIDER_SITE_OTHER): Payer: 59 | Admitting: Internal Medicine

## 2014-09-16 VITALS — BP 118/68 | HR 72 | Temp 97.8°F | Resp 18 | Ht 68.0 in | Wt 196.0 lb

## 2014-09-16 DIAGNOSIS — M25512 Pain in left shoulder: Secondary | ICD-10-CM | POA: Diagnosis not present

## 2014-09-16 MED ORDER — PREDNISONE 20 MG PO TABS: ORAL_TABLET | ORAL | Status: DC

## 2014-09-16 MED ORDER — MELOXICAM 15 MG PO TABS: 15.0000 mg | ORAL_TABLET | Freq: Every day | ORAL | Status: DC

## 2014-09-16 NOTE — Progress Notes (Signed)
Subjective:  This chart was scribed for [name], MD by [facility], medical scribe at [facility].The patient was seen in exam room 03 and the patient's care was started at 4:47 PM.   Patient ID: Harold Newman, male    DOB: 1955/11/06, 59 y.o.   MRN: [id number] Chief Complaint  Patient presents with  . Arm Pain    pulled muscle left arm about a week ago   . Arm Injury    left   HPI HPI Comments: BRICEN VICTORY is a 60 y.o. male who presents to [facility] complaining of left arm injury after lifting a dog crate with a dog in the crate 10 days ago. He did hear a pop at the time of the injury. Pain throughout the day, with certain movements and positions. He describes the pain as intermittent and ibuprofen has provided minor relief. 1-2 ibuprofen did not work, so he has been taking 3. Last dose was 2-3 hours ago.  Past Medical History  Diagnosis Date  . Bipolar disorder   . CAD (coronary artery disease)   . Hypertension   . Hyperlipidemia    Prior to Admission medications   Medication Sig Start Date End Date Taking? Authorizing Provider  amLODipine (NORVASC) 5 MG tablet Take 1 tablet (5 mg total) by mouth daily. 07/01/14  Yes Tonye Pearson, MD  aspirin 325 MG tablet Take 325 mg by mouth daily.   Yes Historical Provider, MD  co-enzyme Q-10 30 MG capsule Take 200 mg by mouth daily.   Yes Historical Provider, MD  Cyanocobalamin (VITAMIN B-12 PO) Take by mouth daily. 5000 i.u.   Yes Historical Provider, MD  D-Ribose POWD 5 g by Does not apply route daily.   Yes Historical Provider, MD  dicyclomine (BENTYL) 10 MG capsule Take 10 mg by mouth as needed. 05/28/11  Yes Dois Davenport, MD  lansoprazole (PREVACID) 15 MG capsule Take 30 mg by mouth daily.    Yes Historical Provider, MD  LevOCARNitine L-Tartrate (L-CARNITINE) 500 MG CAPS Take 500 mg by mouth daily.   Yes Historical Provider, MD  lithium carbonate (ESKALITH) 450 MG CR tablet Take  675 mg by mouth daily.   Yes Historical Provider, MD  LORazepam (ATIVAN) 1 MG tablet Take 1 mg by mouth as needed.    Yes Historical Provider, MD  losartan (COZAAR) 25 MG tablet Take 1 tablet (25 mg total) by mouth daily. 01/10/14  Yes Lennette Bihari, MD  metoprolol succinate (TOPROL XL) 50 MG 24 hr tablet Take 1 tablet (50 mg total) by mouth daily. Take with or immediately following a meal. 07/01/14  Yes Tonye Pearson, MD  Multiple Vitamins-Minerals (MULTIVITAMIN WITH MINERALS) tablet Take 1 tablet by mouth daily.   Yes Historical Provider, MD  NAFTIN 2 % CREA Apply 1 application topically as needed. 06/02/13  Yes Historical Provider, MD  niacin (NIASPAN) 500 MG CR tablet Take 1 tablet (500 mg total) by mouth at bedtime. 07/20/14  Yes Lennette Bihari, MD  nitroGLYCERIN (NITROSTAT) 0.4 MG SL tablet Place 1 tablet (0.4 mg total) under the tongue every 5 (five) minutes as needed for chest pain. 07/06/13  Yes Lennette Bihari, MD  rosuvastatin (CRESTOR) 40 MG tablet Take 1 tablet (40 mg total) by mouth daily. Patient needs to call our office to schedule an appointment for future refills. 07/04/14  Yes Lennette Bihari, MD   Allergies  Allergen Reactions  .  Anticoagulant Compound     PLAVIX: Other reaction(s): Other (See Comments) "Bleeding issues"   Review of Systems  Musculoskeletal: Positive for myalgias.      Objective:  BP 118/68 mmHg  Pulse 72  Temp(Src) 97.8 F (36.6 C) (Oral)  Resp 18  Ht 5\' 8"  (1.727 m)  Wt 196 lb (88.905 kg)  BMI 29.81 kg/m2  SpO2 98% Physical Exam  Constitutional: He is oriented to person, place, and time. He appears well-developed and well-nourished. No distress.  HENT:  Head: Normocephalic and atraumatic.  Eyes: Pupils are equal, round, and reactive to light.  Neck: Normal range of motion.  Cardiovascular: Normal rate and regular rhythm.   Pulmonary/Chest: Effort normal. No respiratory distress.  Musculoskeletal: Normal range of motion.  L shoulder w/ pAIN W/  ROM ESPEC abd ag resis and elev >90 No crep  Neurological: He is alert and oriented to person, place, and time.  Skin: Skin is warm and dry.  Psychiatric: He has a normal mood and affect. His behavior is normal.  Nursing note and vitals reviewed.     Assessment & Plan:  Pain in left shoulder RC strain Meds ordered this encounter  Medications  . predniSONE (DELTASONE) 20 MG tablet    Sig: 3/3/2/2/1/1 single daily dose for 6 days    Dispense:  12 tablet    Refill:  0  . meloxicam (MOBIC) 15 MG tablet    Sig: Take 1 tablet (15 mg total) by mouth daily.    Dispense:  30 tablet    Refill:  0   Exercises given///PT next if needed    I have completed the patient encounter in its entirety as documented by the scribe, with editing by me where necessary. Alysiana Ethridge P. Merla Riches, M.D.

## 2014-09-17 ENCOUNTER — Other Ambulatory Visit: Payer: Self-pay | Admitting: Cardiovascular Disease

## 2014-09-17 NOTE — Telephone Encounter (Signed)
Rx request sent to pharmacy.  

## 2014-09-21 ENCOUNTER — Telehealth: Payer: Self-pay | Admitting: Cardiovascular Disease

## 2014-09-21 NOTE — Telephone Encounter (Signed)
Would like to speak with a nurse about getting written prescriptions for his medications so they can be mailed to [place].

## 2014-09-21 NOTE — Telephone Encounter (Signed)
LM for patient that medications can be printed and signed by MD next week.   Routed to [facility] to prepare refills (unsure which Rxs patient gets from Dr. Tresa Endo) and have MD sign

## 2014-09-26 ENCOUNTER — Other Ambulatory Visit: Payer: Self-pay | Admitting: *Deleted

## 2014-09-26 MED ORDER — AMLODIPINE BESYLATE 5 MG PO TABS: 5.0000 mg | ORAL_TABLET | Freq: Every day | ORAL | Status: DC

## 2014-09-26 MED ORDER — ROSUVASTATIN CALCIUM 40 MG PO TABS: 40.0000 mg | ORAL_TABLET | Freq: Every day | ORAL | Status: DC

## 2014-09-26 MED ORDER — METOPROLOL SUCCINATE ER 50 MG PO TB24: 50.0000 mg | ORAL_TABLET | Freq: Every day | ORAL | Status: DC

## 2014-09-26 MED ORDER — NIACIN ER (ANTIHYPERLIPIDEMIC) 500 MG PO TBCR: 500.0000 mg | EXTENDED_RELEASE_TABLET | Freq: Every day | ORAL | Status: DC

## 2014-09-26 MED ORDER — LOSARTAN POTASSIUM 25 MG PO TABS: 25.0000 mg | ORAL_TABLET | Freq: Every day | ORAL | Status: DC

## 2014-09-26 NOTE — Telephone Encounter (Signed)
Prescriptions ordered and given to Dr. Tresa Endo for signature.

## 2014-09-28 ENCOUNTER — Telehealth: Payer: Self-pay

## 2014-09-28 NOTE — Telephone Encounter (Signed)
Patient walked in office requesting when Dr.Kelly signs prescriptions mail to him.Message sent to [facility].

## 2014-10-02 ENCOUNTER — Telehealth: Payer: Self-pay | Admitting: *Deleted

## 2014-10-02 ENCOUNTER — Other Ambulatory Visit: Payer: Self-pay | Admitting: Cardiovascular Disease

## 2014-10-02 MED ORDER — NITROGLYCERIN 0.4 MG SL SUBL: 0.4000 mg | SUBLINGUAL_TABLET | SUBLINGUAL | Status: DC | PRN

## 2014-10-02 NOTE — Telephone Encounter (Signed)
Electronic refill sent for NTG.  Patient notified.

## 2014-10-02 NOTE — Telephone Encounter (Signed)
°  1. Which medications need to be refilled? Nitrostat   2. Which pharmacy is medication to be sent to?[facility] in [place], store [phone]  3. Do they need a 30 day or 90 day supply? 30  4. Would they like a call back once the medication has been sent to the pharmacy? Yes

## 2014-10-02 NOTE — Telephone Encounter (Signed)
Prescriptions were signed and mailed out 09/27/14 patient called and notified.

## 2014-10-02 NOTE — Telephone Encounter (Signed)
Left message that written prescriptions were mailed out on last Friday. If he does not receive them by the end of the week he is to call back to let us know.

## 2014-12-24 ENCOUNTER — Other Ambulatory Visit: Payer: Self-pay | Admitting: *Deleted

## 2014-12-24 ENCOUNTER — Encounter: Payer: Self-pay | Admitting: *Deleted

## 2014-12-24 DIAGNOSIS — E785 Hyperlipidemia, unspecified: Secondary | ICD-10-CM

## 2014-12-24 DIAGNOSIS — I10 Essential (primary) hypertension: Secondary | ICD-10-CM

## 2014-12-24 DIAGNOSIS — I25810 Atherosclerosis of coronary artery bypass graft(s) without angina pectoris: Secondary | ICD-10-CM

## 2014-12-24 DIAGNOSIS — Z79899 Other long term (current) drug therapy: Secondary | ICD-10-CM

## 2015-02-11 ENCOUNTER — Other Ambulatory Visit: Payer: Self-pay | Admitting: Cardiovascular Disease

## 2015-02-12 LAB — CBC/DIFF AMBIGUOUS DEFAULT
BASOS ABS: 0.1 10*3/uL (ref 0.0–0.2)
Basos: 1 %
EOS (ABSOLUTE): 0.2 10*3/uL (ref 0.0–0.4)
EOS: 3 %
HEMATOCRIT: 46.6 % (ref 37.5–51.0)
Hemoglobin: 15.9 g/dL (ref 12.6–17.7)
IMMATURE GRANULOCYTES: 0 %
Immature Grans (Abs): 0 10*3/uL (ref 0.0–0.1)
LYMPHS ABS: 2.4 10*3/uL (ref 0.7–3.1)
Lymphs: 35 %
MCH: 30.7 pg (ref 26.6–33.0)
MCHC: 34.1 g/dL (ref 31.5–35.7)
MCV: 90 fL (ref 79–97)
MONOS ABS: 0.5 10*3/uL (ref 0.1–0.9)
Monocytes: 7 %
NEUTROS PCT: 54 %
Neutrophils Absolute: 3.7 10*3/uL (ref 1.4–7.0)
PLATELETS: 144 10*3/uL — AB (ref 150–379)
RBC: 5.18 x10E6/uL (ref 4.14–5.80)
RDW: 14.2 % (ref 12.3–15.4)
WBC: 6.8 10*3/uL (ref 3.4–10.8)

## 2015-02-12 LAB — TSH: TSH: 2.36 u[IU]/mL (ref 0.450–4.500)

## 2015-03-18 ENCOUNTER — Telehealth: Payer: Self-pay

## 2015-03-18 NOTE — Telephone Encounter (Addendum)
Pt states he was given some Albuterol Inhaler one time from Dr Merla Riches and would like to have another refill called in. Please call [phone]     [facility] [place] AT [phone]

## 2015-03-20 MED ORDER — ALBUTEROL SULFATE HFA 108 (90 BASE) MCG/ACT IN AERS: 2.0000 | INHALATION_SPRAY | Freq: Four times a day (QID) | RESPIRATORY_TRACT | Status: DC | PRN

## 2015-03-20 NOTE — Telephone Encounter (Signed)
Dr Merla Riches, you gave pt inhaler in June when he was last in for an actual check up, but only gave him #1 w/no RFs. Do you want to give him a RF and advise he needs to RTC for f/up? Or just tell him he needs to come in for the refill to begin with?

## 2015-03-20 NOTE — Telephone Encounter (Signed)
Meds ordered this encounter  Medications  . albuterol (PROVENTIL HFA;VENTOLIN HFA) 108 (90 Base) MCG/ACT inhaler    Sig: Inhale 2 puffs into the lungs every 6 (six) hours as needed for wheezing or shortness of breath.    Dispense:  1 Inhaler    Refill:  1

## 2015-03-20 NOTE — Telephone Encounter (Signed)
Notified pt on VM of RFs and need for f/up.

## 2015-04-16 ENCOUNTER — Telehealth: Payer: Self-pay | Admitting: Family Medicine

## 2015-04-16 NOTE — Telephone Encounter (Signed)
Called patient to see if they have had the flu shot.   If they have where and when?  If not ask them to come in and get it.  If they decline please document it in health maintenance. 

## 2015-07-07 ENCOUNTER — Other Ambulatory Visit: Payer: Self-pay | Admitting: Cardiovascular Disease

## 2015-07-08 NOTE — Telephone Encounter (Signed)
Rx request sent to pharmacy.  

## 2015-09-06 ENCOUNTER — Encounter: Payer: Self-pay | Admitting: Family Medicine

## 2015-09-06 ENCOUNTER — Ambulatory Visit (INDEPENDENT_AMBULATORY_CARE_PROVIDER_SITE_OTHER): Payer: 59 | Admitting: Family Medicine

## 2015-09-06 VITALS — BP 120/72 | HR 70 | Temp 97.4°F | Resp 16 | Ht 67.25 in | Wt 202.0 lb

## 2015-09-06 DIAGNOSIS — I10 Essential (primary) hypertension: Secondary | ICD-10-CM | POA: Diagnosis not present

## 2015-09-06 DIAGNOSIS — F3170 Bipolar disorder, currently in remission, most recent episode unspecified: Secondary | ICD-10-CM

## 2015-09-06 DIAGNOSIS — Z131 Encounter for screening for diabetes mellitus: Secondary | ICD-10-CM | POA: Diagnosis not present

## 2015-09-06 DIAGNOSIS — Z125 Encounter for screening for malignant neoplasm of prostate: Secondary | ICD-10-CM | POA: Diagnosis not present

## 2015-09-06 DIAGNOSIS — Z1159 Encounter for screening for other viral diseases: Secondary | ICD-10-CM | POA: Diagnosis not present

## 2015-09-06 DIAGNOSIS — Z0000 Encounter for general adult medical examination without abnormal findings: Secondary | ICD-10-CM

## 2015-09-06 DIAGNOSIS — I2510 Atherosclerotic heart disease of native coronary artery without angina pectoris: Secondary | ICD-10-CM | POA: Diagnosis not present

## 2015-09-06 DIAGNOSIS — L309 Dermatitis, unspecified: Secondary | ICD-10-CM | POA: Diagnosis not present

## 2015-09-06 DIAGNOSIS — Z114 Encounter for screening for human immunodeficiency virus [HIV]: Secondary | ICD-10-CM | POA: Diagnosis not present

## 2015-09-06 DIAGNOSIS — I2583 Coronary atherosclerosis due to lipid rich plaque: Secondary | ICD-10-CM

## 2015-09-06 DIAGNOSIS — E785 Hyperlipidemia, unspecified: Secondary | ICD-10-CM | POA: Diagnosis not present

## 2015-09-06 LAB — POC MICROSCOPIC URINALYSIS (UMFC): MUCUS RE: ABSENT

## 2015-09-06 LAB — POCT URINALYSIS DIP (MANUAL ENTRY)
Bilirubin, UA: NEGATIVE
GLUCOSE UA: NEGATIVE
Ketones, POC UA: NEGATIVE
Leukocytes, UA: NEGATIVE
NITRITE UA: NEGATIVE
Protein Ur, POC: NEGATIVE
Spec Grav, UA: 1.01
UROBILINOGEN UA: 0.2
pH, UA: 5.5

## 2015-09-06 MED ORDER — AMLODIPINE BESYLATE 5 MG PO TABS: 5.0000 mg | ORAL_TABLET | Freq: Every day | ORAL | 3 refills | Status: DC

## 2015-09-06 MED ORDER — DICYCLOMINE HCL 10 MG PO CAPS
10.0000 mg | ORAL_CAPSULE | Freq: Three times a day (TID) | ORAL | 2 refills | Status: DC
Start: 2015-09-06 — End: 2015-09-06

## 2015-09-06 MED ORDER — DICYCLOMINE HCL 10 MG PO CAPS: 10.0000 mg | ORAL_CAPSULE | Freq: Three times a day (TID) | ORAL | 2 refills | Status: DC

## 2015-09-06 NOTE — Progress Notes (Signed)
u  dg

## 2015-09-06 NOTE — Patient Instructions (Addendum)
   IF you received an x-ray today, you will receive an invoice from [facility]. Please contact [facility] at [phone] with questions or concerns regarding your invoice.   IF you received labwork today, you will receive an invoice from [facility]. Please contact [facility] at [phone] with questions or concerns regarding your invoice.   Our billing staff will not be able to assist you with questions regarding bills from these companies.  You will be contacted with the lab results as soon as they are available. The fastest way to get your results is to activate your My Chart account. Instructions are located on the last page of this paperwork. If you have not heard from us regarding the results in 2 weeks, please contact this office.    Keeping you healthy  Get these tests  Blood pressure- Have your blood pressure checked once a year by your healthcare provider.  Normal blood pressure is 120/80  Weight- Have your body mass index (BMI) calculated to screen for obesity.  BMI is a measure of body fat based on height and weight. You can also calculate your own BMI at www.nhlbis[place]ort.com/bmi/.  Cholesterol- Have your cholesterol checked every year.  Diabetes- Have your blood sugar checked regularly if you have high blood pressure, high cholesterol, have a family history of diabetes or if you are overweight.  Screening for Colon Cancer- Colonoscopy starting at age 50.  Screening may begin sooner depending on your family history and other health conditions. Follow up colonoscopy as directed by your Gastroenterologist.  Screening for Prostate Cancer- Both blood work (PSA) and a rectal exam help screen for Prostate Cancer.  Screening begins at age 40 with African-American men and at age 50 with Caucasian men.  Screening may begin sooner depending on your family history.  Take these medicines  Aspirin- One aspirin daily can help prevent Heart  disease and Stroke.  Flu shot- Every fall.  Tetanus- Every 10 years.  Zostavax- Once after the age of 60 to prevent Shingles.  Pneumonia shot- Once after the age of 65; if you are younger than 65, ask your healthcare provider if you need a Pneumonia shot.  Take these steps  Don't smoke- If you do smoke, talk to your doctor about quitting.  For tips on how to quit, go to www.smokefree.gov or call 1-800-QUIT-NOW.  Be physically active- Exercise 5 days a week for at least 30 minutes.  If you are not already physically active start slow and gradually work up to 30 minutes of moderate physical activity.  Examples of moderate activity include walking briskly, mowing the yard, dancing, swimming, bicycling, etc.  Eat a healthy diet- Eat a variety of healthy food such as fruits, vegetables, low fat milk, low fat cheese, yogurt, lean meant, poultry, fish, beans, tofu, etc. For more information go to www.thenutritionsource.org  Drink alcohol in moderation- Limit alcohol intake to less than two drinks a day. Never drink and drive.  Dentist- Brush and floss twice daily; visit your dentist twice a year.  Depression- Your emotional health is as important as your physical health. If you're feeling down, or losing interest in things you would normally enjoy please talk to your healthcare provider.  Eye exam- Visit your eye doctor every year.  Safe sex- If you may be exposed to a sexually transmitted infection, use a condom.  Seat belts- Seat belts can save your life; always wear one.  Smoke/Carbon Monoxide detectors- These detectors need to be installed on   the appropriate level of your home.  Replace batteries at least once a year.  Skin cancer- When out in the sun, cover up and use sunscreen 15 SPF or higher.  Violence- If anyone is threatening you, please tell your healthcare provider.  Living Will/ Health care power of attorney- Speak with your healthcare provider and family. 

## 2015-09-06 NOTE — Progress Notes (Signed)
Patient ID: Harold Newman, male   DOB: 1955-06-14, 60 y.o.   MRN: [id number]   Subjective:  By signing my name below, I, Stann Oresung-Kai Tsai, attest that this documentation has been prepared under the direction and in the presence of Nilda SimmerKristi Loveta Dellis, MD. Electronically Signed: Stann Oresung-Kai Tsai, Scribe. 09/06/2015 , 2:49 PM .  Patient was seen in Room 11 .   Patient ID: Harold Newman, male    DOB: 1955-06-14, 60 y.o.   MRN: [id number]  09/06/2015  Biometric Screening (Pt would like to return for labs in the morning)   HPI Harold Newman is a 60 y.o. male who [facility] biometric screening paperwork completed for work and complete physical examination. He also needs cholesterol and A1c done. He usually comes in to be seen for acute issues with Dr. Merla Richesoolittle.   Cancer Screening His last colonoscopy was in 2013, which showed 1 small polyp and repeat in 5 years. He denies family history of colon cancer.  Immunizations He's never had chicken pox.  He is due for tetanus but will update at another time.  Vision He is overdue for eye doctor visit. He wears reading glasses.  Dentist He's scheduled for dentist appointment on 11/07/15.   CAD He has history of CAD with stent placed. Last heart catheterization was in 2007. He denies heart attack. He denies chest pain, shortness of breath, cough, numbness or leg swelling.   IBS He has a history of IBS, which causes him to have abdominal cramping. He has a bowel movement everyday. He denies dark stools, or diarrhea.   Inguinal Hernia He has history of an inguinal hernia, but was informed mostly fatty deposit. He denies any pain in the area.   Family His mother is deceased, with history of aplastic anemia and HTN.  His father passed away with CHF; was a smoker and drinker, no history of heart attack.  He had 2 brothers - both passed: 1 with brain tumor and other one had parkinson's.   He has 2 biological sons from his previous marriage  and adopted his ex-wife's son from previous marriage. He has 2 grandchildren with 1 on the way. He lives with his significant other.   General He denies smoking. He rarely drinks, at most 2 glasses of wine a week. He goes to sleep at around 11:00PM.   Exercise He walks about 3 times a week, about a mile or 2.   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   Review of Systems  Constitutional: Negative for activity change, appetite change, chills, diaphoresis, fatigue, fever and unexpected weight change.  HENT: Negative for congestion, dental problem, drooling, ear discharge, ear pain, facial swelling, hearing loss, mouth sores, nosebleeds, postnasal drip, rhinorrhea, sinus pressure, sneezing, sore throat, tinnitus, trouble swallowing and voice change.   Eyes: Negative for photophobia, pain, discharge, redness, itching and visual disturbance.  Respiratory: Negative for apnea, cough, choking, chest tightness, shortness of breath, wheezing and stridor.   Cardiovascular: Negative for chest pain, palpitations and leg swelling.  Gastrointestinal: Negative for abdominal pain, blood in stool, constipation, diarrhea, nausea and vomiting.  Endocrine: Negative for cold intolerance, heat intolerance, polydipsia, polyphagia and polyuria.  Genitourinary: Negative for decreased urine volume, difficulty urinating, discharge, dysuria, enuresis, flank pain, frequency, genital sores, hematuria, penile pain, penile swelling, scrotal swelling, testicular pain and urgency.  Musculoskeletal: Negative for arthralgias, back pain, gait problem, joint swelling, myalgias, neck pain and neck stiffness.  Skin: Negative for color  change, pallor, rash and wound.  Allergic/Immunologic: Negative for environmental allergies, food allergies and immunocompromised state.  Neurological: Negative for dizziness, tremors, seizures, syncope, facial asymmetry, speech difficulty, weakness, light-headedness, numbness  and headaches.  Hematological: Negative for adenopathy. Does not bruise/bleed easily.  Psychiatric/Behavioral: Negative for agitation, behavioral problems, confusion, decreased concentration, dysphoric mood, hallucinations, self-injury, sleep disturbance and suicidal ideas. The patient is not nervous/anxious and is not hyperactive.     Past Medical History:  Diagnosis Date  . Bipolar disorder (HCC)   . CAD (coronary artery disease)   . GERD (gastroesophageal reflux disease)   . Hyperlipidemia   . Hypertension   . Irritable bowel syndrome (IBS)    Past Surgical History:  Procedure Laterality Date  . APPENDECTOMY  10/2000  . CARDIAC CATHETERIZATION  03/17/2005   low-normal LV function, 2 vessel CAD (subtotal occlusion of LAD, stenosis of small 2nd diagonal and with mid LAD narrowing, total occlusion of distal Cfx); PTCA and stent of LAD and 2nd diagonal with 3.5x106mm Cypher DES (Dr. Nicki Guadalajara)  . CARDIAC CATHETERIZATION  04/14/2005   2.5x34mm Cypher DES to L Cfx (Dr. Nicki Guadalajara)  . COLONOSCOPY  08/2011  . NM MYOCAR PERF WALL MOTION  2011   bruce myoview - normal pattern of perfusion, low risk  . TRANSTHORACIC ECHOCARDIOGRAM  2007   mild MR & TR, AV mildly sclerotic   Allergies  Allergen Reactions  . Anticoagulant Compound     PLAVIX: Other reaction(s): Other (See Comments) "Bleeding issues"  . Meloxicam Nausea Only   Current Outpatient Prescriptions  Medication Sig Dispense Refill  . amLODipine (NORVASC) 5 MG tablet Take 1 tablet (5 mg total) by mouth daily. 90 tablet 3  . aspirin 325 MG tablet Take 325 mg by mouth daily.    Marland Kitchen co-enzyme Q-10 30 MG capsule Take 200 mg by mouth daily.    . Cyanocobalamin (VITAMIN B-12 PO) Take by mouth daily. 5000 i.u.    Marland Kitchen D-Ribose POWD 5 g by Does not apply route daily.    Marland Kitchen dicyclomine (BENTYL) 10 MG capsule Take 1 capsule (10 mg total) by mouth 3 (three) times daily before meals. 60 capsule 2  . lansoprazole (PREVACID) 15 MG capsule Take  30 mg by mouth daily.     . LevOCARNitine L-Tartrate (L-CARNITINE) 500 MG CAPS Take 500 mg by mouth daily.    Marland Kitchen lithium carbonate (ESKALITH) 450 MG CR tablet Take 675 mg by mouth daily.    Marland Kitchen LORazepam (ATIVAN) 1 MG tablet Take 1 mg by mouth as needed.     Marland Kitchen losartan (COZAAR) 25 MG tablet Take 1 tablet (25 mg total) by mouth daily. 90 tablet 3  . metoprolol succinate (TOPROL XL) 50 MG 24 hr tablet Take 1 tablet (50 mg total) by mouth daily. Take with or immediately following a meal. 90 tablet 3  . Multiple Vitamins-Minerals (MULTIVITAMIN WITH MINERALS) tablet Take 1 tablet by mouth daily.    Marland Kitchen NAFTIN 2 % CREA Apply 1 application topically as needed.    Marland Kitchen NATTOKINASE PO Take by mouth.    . niacin (NIASPAN) 500 MG CR tablet Take 1 tablet (500 mg total) by mouth at bedtime. 90 tablet 3  . NITROSTAT 0.4 MG SL tablet PLACE ONE TABLET UNDER THE TONGUE EVERY FIVE MINUTES AS NEEDED FOR CHEST PAIN 25 tablet 4  . Omega-3 Fatty Acids (FISH OIL PO) Take by mouth.    . rosuvastatin (CRESTOR) 40 MG tablet Take 1 tablet (40 mg total) by mouth  daily. 90 tablet 3  . albuterol (PROVENTIL HFA;VENTOLIN HFA) 108 (90 Base) MCG/ACT inhaler Inhale 2 puffs into the lungs every 6 (six) hours as needed for wheezing or shortness of breath. (Patient not taking: Reported on 09/06/2015) 1 Inhaler 1   No current facility-administered medications for this visit.    Social History   Social History  . Marital status: Married    Spouse name: N/A  . Number of children: 3  . Years of education: 28   Occupational History  . Not on file.   Social History Main Topics  . Smoking status: Never Smoker  . Smokeless tobacco: Never Used  . Alcohol use Not on file  . Drug use: Unknown  . Sexual activity: Not on file   Other Topics Concern  . 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 Aplastic anemia Mother   . Hypertension Mother   . Heart failure Father   . Heart disease Father     CHF  . Heart attack Maternal Grandfather   . Hypertension Brother   . Parkinson's disease Brother   . Brain cancer Brother     tumor       Objective:    BP 120/72   Pulse 70   Temp 97.4 F (36.3 C) (Oral)   Resp 16   Ht 5' 7.25" (1.708 m)   Wt 202 lb (91.6 kg)   BMI 31.40 kg/m   Physical Exam  Constitutional: He is oriented to person, place, and time. He appears well-developed and well-nourished. No distress.  HENT:  Head: Normocephalic and atraumatic.  Right Ear: External ear normal.  Left Ear: External ear normal.  Nose: Nose normal.  Mouth/Throat: Oropharynx is clear and moist.  Eyes: Conjunctivae and EOM are normal. Pupils are equal, round, and reactive to light.  Neck: Normal range of motion. Neck supple. Carotid bruit is not present. No thyromegaly present.  Cardiovascular: Normal rate, regular rhythm, normal heart sounds and intact distal pulses.  Exam reveals no gallop and no friction rub.   No murmur heard. Pulmonary/Chest: Effort normal and breath sounds normal. No respiratory distress. He has no wheezes. He has no rales.  Abdominal: Soft. Bowel sounds are normal. He exhibits no distension and no mass. There is no tenderness. There is no rebound and no guarding. Hernia confirmed negative in the right inguinal area and confirmed negative in the left inguinal area.  Genitourinary: Prostate normal, testes normal and penis normal. Circumcised.  Musculoskeletal: Normal range of motion.       Right shoulder: Normal.       Left shoulder: Normal.       Cervical back: Normal.  Lymphadenopathy:    He has no cervical adenopathy.       Right: No inguinal adenopathy present.       Left: No inguinal  adenopathy present.  Neurological: He is alert and oriented to person, place, and time. He has normal reflexes. No cranial nerve deficit. He exhibits normal muscle tone. Coordination normal.  Skin: Skin is warm and  dry. No rash noted. He is not diaphoretic.  Psychiatric: He has a normal mood and affect. His behavior is normal. Judgment and thought content normal.  Nursing note and vitals reviewed.  Results for orders placed or performed in visit on 09/06/15  POCT urinalysis dipstick  Result Value Ref Range   Color, UA yellow yellow   Clarity, UA clear clear   Glucose, UA negative negative   Bilirubin, UA negative negative   Ketones, POC UA negative negative   Spec Grav, UA 1.010    Blood, UA trace-intact (A) negative   pH, UA 5.5    Protein Ur, POC negative negative   Urobilinogen, UA 0.2    Nitrite, UA Negative Negative   Leukocytes, UA Negative Negative  POCT Microscopic Urinalysis ([facility])  Result Value Ref Range   WBC,UR,HPF,POC None None WBC/hpf   RBC,UR,HPF,POC None None RBC/hpf   Bacteria Few (A) None, Too numerous to count   Mucus Absent Absent   Epithelial Cells, UR Per Microscopy Few (A) None, Too numerous to count cells/hpf       Assessment & Plan:   1. Routine physical examination   2. Coronary artery disease due to lipid rich plaque   3. Essential hypertension   4. Eczema   5. Hyperlipidemia with target LDL less than 70   6. Bipolar affective disorder in remission (HCC)   7. Screening for HIV (human immunodeficiency virus)   8. Need for hepatitis C screening test   9. Screening for diabetes mellitus   10. Screening for prostate cancer     Orders Placed This Encounter  Procedures  . CBC with Differential/Platelet    Standing Status:   Future    Number of Occurrences:   1    Standing Expiration Date:   09/05/2016  . Comprehensive metabolic panel    Standing Status:   Future    Number of Occurrences:   1    Standing Expiration Date:   09/05/2016    Order  Specific Question:   Has the patient fasted?    Answer:   Yes  . Hemoglobin A1c    Standing Status:   Future    Number of Occurrences:   1    Standing Expiration Date:   09/05/2016  . Hepatitis C antibody    Standing Status:   Future    Number of Occurrences:   1    Standing Expiration Date:   09/05/2016  . HIV antibody    Standing Status:   Future    Number of Occurrences:   1    Standing Expiration Date:   09/05/2016  . PSA    Standing Status:   Future    Number of Occurrences:   1    Standing Expiration Date:   09/05/2016  . TSH    Standing Status:   Future    Number of Occurrences:   1    Standing Expiration Date:   09/05/2016  . Lipid panel    Standing Status:   Future    Number of Occurrences:   1    Standing Expiration Date:   09/05/2016    Order Specific Question:   Has the patient fasted?    Answer:   Yes  . POCT urinalysis dipstick  . POCT Microscopic Urinalysis ([facility])   Meds ordered this encounter  Medications  . Omega-3 Fatty Acids (FISH OIL PO)    Sig: Take by mouth.  Marland Kitchen NATTOKINASE PO    Sig: Take by  mouth.  . amLODipine (NORVASC) 5 MG tablet    Sig: Take 1 tablet (5 mg total) by mouth daily.    Dispense:  90 tablet    Refill:  3  . DISCONTD: dicyclomine (BENTYL) 10 MG capsule    Sig: Take 1 capsule (10 mg total) by mouth 3 (three) times daily before meals.    Dispense:  60 capsule    Refill:  2  . dicyclomine (BENTYL) 10 MG capsule    Sig: Take 1 capsule (10 mg total) by mouth 3 (three) times daily before meals.    Dispense:  60 capsule    Refill:  2    Return in about 1 year (around 09/05/2016) for complete physical examiniation.    I personally performed the services described in this documentation, which was scribed in my presence. The recorded information has been reviewed and considered.  Antawan Mchugh Paulita Fujita, M.D. [facility] [address] [place], [place]  [address] [phone] phone [phone] fax

## 2015-09-07 ENCOUNTER — Other Ambulatory Visit (INDEPENDENT_AMBULATORY_CARE_PROVIDER_SITE_OTHER): Payer: 59 | Admitting: Family Medicine

## 2015-09-07 DIAGNOSIS — Z1159 Encounter for screening for other viral diseases: Secondary | ICD-10-CM

## 2015-09-07 DIAGNOSIS — I2583 Coronary atherosclerosis due to lipid rich plaque: Principal | ICD-10-CM

## 2015-09-07 DIAGNOSIS — I2510 Atherosclerotic heart disease of native coronary artery without angina pectoris: Secondary | ICD-10-CM

## 2015-09-07 DIAGNOSIS — Z131 Encounter for screening for diabetes mellitus: Secondary | ICD-10-CM | POA: Diagnosis not present

## 2015-09-07 DIAGNOSIS — E785 Hyperlipidemia, unspecified: Secondary | ICD-10-CM

## 2015-09-07 DIAGNOSIS — Z114 Encounter for screening for human immunodeficiency virus [HIV]: Secondary | ICD-10-CM

## 2015-09-07 DIAGNOSIS — I10 Essential (primary) hypertension: Secondary | ICD-10-CM

## 2015-09-07 DIAGNOSIS — Z125 Encounter for screening for malignant neoplasm of prostate: Secondary | ICD-10-CM | POA: Diagnosis not present

## 2015-09-07 DIAGNOSIS — F3170 Bipolar disorder, currently in remission, most recent episode unspecified: Secondary | ICD-10-CM

## 2015-09-07 LAB — HEMOGLOBIN A1C
Hgb A1c MFr Bld: 5.3 % (ref <5.7)
MEAN PLASMA GLUCOSE: 105 mg/dL

## 2015-09-07 LAB — COMPREHENSIVE METABOLIC PANEL
ALBUMIN: 4.3 g/dL (ref 3.6–5.1)
ALK PHOS: 58 U/L (ref 40–115)
ALT: 21 U/L (ref 9–46)
AST: 23 U/L (ref 10–35)
BUN: 10 mg/dL (ref 7–25)
CALCIUM: 9.2 mg/dL (ref 8.6–10.3)
CO2: 24 mmol/L (ref 20–31)
Chloride: 102 mmol/L (ref 98–110)
Creat: 0.9 mg/dL (ref 0.70–1.25)
GLUCOSE: 99 mg/dL (ref 65–99)
POTASSIUM: 4.6 mmol/L (ref 3.5–5.3)
Sodium: 137 mmol/L (ref 135–146)
Total Bilirubin: 0.7 mg/dL (ref 0.2–1.2)
Total Protein: 6.6 g/dL (ref 6.1–8.1)

## 2015-09-07 LAB — CBC WITH DIFFERENTIAL/PLATELET
BASOS ABS: 57 {cells}/uL (ref 0–200)
Basophils Relative: 1 %
EOS ABS: 171 {cells}/uL (ref 15–500)
Eosinophils Relative: 3 %
HEMATOCRIT: 47.5 % (ref 38.5–50.0)
HEMOGLOBIN: 15.9 g/dL (ref 13.2–17.1)
LYMPHS ABS: 1995 {cells}/uL (ref 850–3900)
LYMPHS PCT: 35 %
MCH: 30 pg (ref 27.0–33.0)
MCHC: 33.5 g/dL (ref 32.0–36.0)
MCV: 89.6 fL (ref 80.0–100.0)
MONO ABS: 513 {cells}/uL (ref 200–950)
MPV: 11.7 fL (ref 7.5–12.5)
Monocytes Relative: 9 %
NEUTROS PCT: 52 %
Neutro Abs: 2964 cells/uL (ref 1500–7800)
Platelets: 136 10*3/uL — ABNORMAL LOW (ref 140–400)
RBC: 5.3 MIL/uL (ref 4.20–5.80)
RDW: 14.2 % (ref 11.0–15.0)
WBC: 5.7 10*3/uL (ref 3.8–10.8)

## 2015-09-07 LAB — PSA: PSA: 1.2 ng/mL (ref <=4.0)

## 2015-09-07 LAB — TSH: TSH: 1.93 m[IU]/L (ref 0.40–4.50)

## 2015-09-07 LAB — LIPID PANEL
CHOL/HDL RATIO: 5.3 ratio — AB (ref <=5.0)
Cholesterol: 111 mg/dL — ABNORMAL LOW (ref 125–200)
HDL: 21 mg/dL — ABNORMAL LOW (ref >=40)
LDL Cholesterol: 63 mg/dL (ref <130)
Triglycerides: 136 mg/dL (ref <150)
VLDL: 27 mg/dL (ref <30)

## 2015-09-07 LAB — HEPATITIS C ANTIBODY: HCV AB: NEGATIVE

## 2015-09-08 LAB — HIV ANTIBODY (ROUTINE TESTING W REFLEX): HIV: NONREACTIVE

## 2015-09-11 ENCOUNTER — Telehealth: Payer: Self-pay

## 2015-09-14 ENCOUNTER — Telehealth: Payer: Self-pay

## 2015-09-14 NOTE — Telephone Encounter (Signed)
Patient dropped off a form that needs a signature from Dr. Katrinka BlazingSmith. The form has been placed in Dr. Michaelle CopasSmith's box. Please fax once completed! The information is on the form.

## 2015-09-17 NOTE — Telephone Encounter (Signed)
Patient called to see if the paperwork has been completed. He needs it done this week and will call on Friday to follow up. [facility] would like to be [facility] [place]

## 2015-09-19 ENCOUNTER — Encounter: Payer: Self-pay | Admitting: Family Medicine

## 2015-09-23 ENCOUNTER — Other Ambulatory Visit: Payer: Self-pay | Admitting: *Deleted

## 2015-09-23 MED ORDER — ROSUVASTATIN CALCIUM 40 MG PO TABS: 40.0000 mg | ORAL_TABLET | Freq: Every day | ORAL | 2 refills | Status: DC

## 2015-09-23 MED ORDER — METOPROLOL SUCCINATE ER 50 MG PO TB24: 50.0000 mg | ORAL_TABLET | Freq: Every day | ORAL | 2 refills | Status: DC

## 2015-09-23 NOTE — Telephone Encounter (Signed)
Rx(s) sent to pharmacy electronically.  

## 2015-09-23 NOTE — Progress Notes (Signed)
Lab visit only; no provider encounter. 

## 2015-09-23 NOTE — Telephone Encounter (Signed)
Patient would like these rx's mailed to him. I have verified with the patient that the address on his chart is correct.

## 2015-09-27 NOTE — Telephone Encounter (Signed)
Form completed and provided to patient.

## 2015-10-07 ENCOUNTER — Encounter: Payer: Self-pay | Admitting: Family Medicine

## 2015-10-14 ENCOUNTER — Other Ambulatory Visit: Payer: Self-pay | Admitting: Emergency Medicine

## 2015-10-31 ENCOUNTER — Telehealth: Payer: Self-pay | Admitting: Cardiovascular Disease

## 2015-10-31 NOTE — Telephone Encounter (Signed)
Pt need a written prescription for his Toprol XL 50 mg with refills. Please mail this to him.

## 2015-10-31 NOTE — Telephone Encounter (Signed)
LMTCB PT HAS UPCOMING APPT WITH DR Tresa EndoKELLY 11-22-15.

## 2015-11-01 NOTE — Telephone Encounter (Signed)
LM for pt, unable to send written rx at this time, pt not seen for nearly a year and a half, left detailed message for to to discuss refills at appt and will need to get refills at local pharmacy until appt per protocol.

## 2015-11-01 NOTE — Telephone Encounter (Signed)
Follow up    Wants prescription mail to him . [place] from [place].

## 2015-11-01 NOTE — Telephone Encounter (Signed)
Should have refills at [facility] [place] 06[facility]2014, refill at appt?

## 2015-11-22 ENCOUNTER — Ambulatory Visit (INDEPENDENT_AMBULATORY_CARE_PROVIDER_SITE_OTHER): Payer: 59 | Admitting: Cardiovascular Disease

## 2015-11-22 ENCOUNTER — Encounter: Payer: Self-pay | Admitting: Cardiovascular Disease

## 2015-11-22 ENCOUNTER — Telehealth: Payer: Self-pay | Admitting: *Deleted

## 2015-11-22 VITALS — BP 113/74 | HR 73 | Ht 67.0 in | Wt 204.6 lb

## 2015-11-22 DIAGNOSIS — I2510 Atherosclerotic heart disease of native coronary artery without angina pectoris: Secondary | ICD-10-CM | POA: Diagnosis not present

## 2015-11-22 DIAGNOSIS — E785 Hyperlipidemia, unspecified: Secondary | ICD-10-CM | POA: Diagnosis not present

## 2015-11-22 DIAGNOSIS — Z79899 Other long term (current) drug therapy: Secondary | ICD-10-CM | POA: Diagnosis not present

## 2015-11-22 DIAGNOSIS — I25810 Atherosclerosis of coronary artery bypass graft(s) without angina pectoris: Secondary | ICD-10-CM

## 2015-11-22 DIAGNOSIS — E669 Obesity, unspecified: Secondary | ICD-10-CM

## 2015-11-22 DIAGNOSIS — I2583 Coronary atherosclerosis due to lipid rich plaque: Secondary | ICD-10-CM

## 2015-11-22 DIAGNOSIS — I10 Essential (primary) hypertension: Secondary | ICD-10-CM | POA: Diagnosis not present

## 2015-11-22 MED ORDER — METOPROLOL SUCCINATE ER 50 MG PO TB24: 50.0000 mg | ORAL_TABLET | Freq: Every day | ORAL | 3 refills | Status: DC

## 2015-11-22 MED ORDER — NIACIN ER (ANTIHYPERLIPIDEMIC) 500 MG PO TBCR: 500.0000 mg | EXTENDED_RELEASE_TABLET | Freq: Every day | ORAL | 3 refills | Status: DC

## 2015-11-22 MED ORDER — ROSUVASTATIN CALCIUM 40 MG PO TABS
40.0000 mg | ORAL_TABLET | Freq: Every day | ORAL | 2 refills | Status: DC
Start: 2015-11-22 — End: 2016-06-15

## 2015-11-22 MED ORDER — AMLODIPINE BESYLATE 5 MG PO TABS: 5.0000 mg | ORAL_TABLET | Freq: Every day | ORAL | 3 refills | Status: DC

## 2015-11-22 MED ORDER — LOSARTAN POTASSIUM 25 MG PO TABS: 25.0000 mg | ORAL_TABLET | Freq: Every day | ORAL | 3 refills | Status: DC

## 2015-11-22 MED ORDER — ASPIRIN EC 81 MG PO TBEC: 81.0000 mg | DELAYED_RELEASE_TABLET | Freq: Every day | ORAL | 3 refills | Status: AC

## 2015-11-22 MED ORDER — ROSUVASTATIN CALCIUM 40 MG PO TABS
40.0000 mg | ORAL_TABLET | Freq: Every day | ORAL | 2 refills | Status: DC
Start: 2015-11-22 — End: 2015-11-22

## 2015-11-22 NOTE — Progress Notes (Signed)
Patient ID: Harold Newman, male   DOB: Mar 21, 1955, 60 y.o.   MRN: [id number]      HPI: Harold Newman is a 60 y.o. male who presents to the office today for a 16 month follow-up cardiology evaluation.    Harold Newman has a history of significant hyperlipidemia with markedly elevated small LDL small particles in the past greater than 3000.  In 2007 he was found to have subtotal LAD stenosis as well as total occlusion of the circumflex vessel with collaterals after a nuclear study demonstrated multiple areas of ischemia. He underwent initial intervention to his LAD totally and underwent successful staged intervention to his chronically occluded circumflex vessel with reestablishment of antegrade flow. His last nuclear perfusion study in 2011 continued to show fairly normal perfusion without scar or ischemia.  He has done exceptionally well since that intervention.  However, in February 2015, he noticed a change in symptomatology with a decline in exercise tolerance with exertional dyspnea and also has noticed some mild episodes of chest pain, which is seen to be mild, but similar in quality to his prior discomfort.  I had seen him last year and recommended a subsequent nuclear perfusion study.  He never had this done.  He did have some issues with Plavix in the past causing GI bleeding issues.  He now has been taking nattokinase which he states has alleviated a lot of his symptomatology.  He denies any overt recent GI bleed.  He recently had a URI infection which ultimately cleared after 46 weeks.  During this time, he was not using his regularly he has had in the past.  Laboratory in 2016 revealed a total cholesterol of 123, but his HDL was very low at 20, and his calculated LDL was 75.  In the past.he was noted to have markedly elevated small LDL particles in the setting of very low HDL.  Over the past year, he has continued to do well.  He specifically denies any episodes of chest pain.  He recently  pulled his Achilles tendon and as result, this has limited his exercise.  This has resulted in weight gain.  Last year we discussed getting a five-year follow-up nuclear stress test, but he opted against having this done.  He denies any exertional shortness of breath.  He denies palpitations.  He denies difficulty with sleep.  He recently had follow-up blood work which showed slight improvement in his lipid status with total cholesterol now 111, triglycerides 136, HDL 21, VLDL 27, and LDL 63 on his regimen consisting of Crestor 40 mg and he had resumed taking niacin 500 mg as well as fish oil.  He has been taking the full dose aspirin to reduce potential niacin flush.  However, I have suggested he reduce his aspirin to 81 mg.  He presents for follow-up evaluation.  Past Medical History:  Diagnosis Date  . Bipolar disorder ([place])   . CAD (coronary artery disease)   . GERD (gastroesophageal reflux disease)   . Hyperlipidemia   . Hypertension   . Irritable bowel syndrome (IBS)     Past Surgical History:  Procedure Laterality Date  . APPENDECTOMY  10/2000  . CARDIAC CATHETERIZATION  03/17/2005   low-normal LV function, 2 vessel CAD (subtotal occlusion of LAD, stenosis of small 2nd diagonal and with mid LAD narrowing, total occlusion of distal Cfx); PTCA and stent of LAD and 2nd diagonal with 3.5x57m Cypher DES (Harold Newman  . CARDIAC CATHETERIZATION  04/14/2005  2.5x51mm Cypher DES to L Cfx (Harold Newman)  . COLONOSCOPY  08/2011  . NM MYOCAR PERF WALL MOTION  2011   bruce myoview - normal pattern of perfusion, low risk  . TRANSTHORACIC ECHOCARDIOGRAM  2007   mild MR & TR, AV mildly sclerotic    Allergies  Allergen Reactions  . Anticoagulant Compound     PLAVIX: Other reaction(s): Other (See Comments) "Bleeding issues"  . Clopidogrel Other (See Comments)    "Bleeding issues"  . Meloxicam Nausea Only    Current Outpatient Prescriptions  Medication Sig Dispense Refill  .  albuterol (PROVENTIL HFA;VENTOLIN HFA) 108 (90 Base) MCG/ACT inhaler Inhale 2 puffs into the lungs every 6 (six) hours as needed for wheezing or shortness of breath. 1 Inhaler 1  . amLODipine (NORVASC) 5 MG tablet Take 1 tablet (5 mg total) by mouth daily. 90 tablet 3  . aspirin 325 MG tablet Take 325 mg by mouth daily.    Marland Kitchen co-enzyme Q-10 30 MG capsule Take 200 mg by mouth daily.    . Cyanocobalamin (VITAMIN B-12 PO) Take 5,000 mg by mouth daily. 5000 i.u.     Marland Kitchen D-Ribose POWD 5 g by Does not apply route daily.    Marland Kitchen dicyclomine (BENTYL) 10 MG capsule Take 1 capsule (10 mg total) by mouth 3 (three) times daily before meals. 60 capsule 2  . lansoprazole (PREVACID) 15 MG capsule Take 30 mg by mouth daily.     . LevOCARNitine L-Tartrate (L-CARNITINE) 500 MG CAPS Take 500 mg by mouth daily.    Marland Kitchen LORazepam (ATIVAN) 1 MG tablet Take 1 mg by mouth as needed.     Marland Kitchen losartan (COZAAR) 25 MG tablet Take 1 tablet (25 mg total) by mouth daily. 90 tablet 3  . metoprolol succinate (TOPROL XL) 50 MG 24 hr tablet Take 1 tablet (50 mg total) by mouth daily. Take with or immediately following a meal. 90 tablet 3  . Multiple Vitamins-Minerals (MULTIVITAMIN WITH MINERALS) tablet Take 1 tablet by mouth daily.    Marland Kitchen NAFTIN 2 % CREA Apply 1 application topically as needed.    Marland Kitchen NATTOKINASE PO Take 2,000 mg by mouth.     . niacin (NIASPAN) 500 MG CR tablet Take 1 tablet (500 mg total) by mouth at bedtime. 90 tablet 3  . NITROSTAT 0.4 MG SL tablet PLACE ONE TABLET UNDER THE TONGUE EVERY FIVE MINUTES AS NEEDED FOR CHEST PAIN 25 tablet 4  . Omega-3 Fatty Acids (FISH OIL PO) Take 1 g by mouth daily.     . rosuvastatin (CRESTOR) 40 MG tablet Take 1 tablet (40 mg total) by mouth daily. 90 tablet 2  . valACYclovir (VALTREX) 500 MG tablet Take 500 mg by mouth as needed.     No current facility-administered medications for this visit.     Social History   Social History  . Marital status: Married    Spouse name: N/A  .  Number of children: 3  . Years of education: 63   Occupational History  . Not on file.   Social History Main Topics  . Smoking status: Never Smoker  . Smokeless tobacco: Never Used  . Alcohol use Not on file  . Drug use: Unknown  . Sexual activity: Not on file   Other Topics Concern  . 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 He has 3 children 2 grandchildren. No tobacco or alcohol use.  Family History  Problem Relation Age of Onset  . Aplastic anemia Mother   . Hypertension Mother   . Heart failure Father   . Heart disease Father     CHF  . Heart attack Maternal Grandfather   . Hypertension Brother   . Parkinson's disease Brother   . Brain cancer Brother     tumor    ROS General: Negative; No fevers, chills, or night sweats;  HEENT: Negative; No changes in vision or hearing, sinus congestion, difficulty swallowing Pulmonary: Negative; No cough, wheezing, shortness of breath, hemoptysis Cardiovascular: See HPI;  GI: Positive for GERD; No nausea, vomiting, diarrhea, or abdominal pain GU: Negative; No dysuria, hematuria, or difficulty voiding Musculoskeletal: Negative; no myalgias, joint pain, or weakness Hematologic/Oncology: Negative; no easy bruising, bleeding Endocrine: Negative; no heat/cold intolerance; no diabetes Neuro: Negative; no changes in balance, headaches Skin: Negative; No rashes or skin lesions Psychiatric: Positive for bipolar disorder, stable on medical therapy No behavioral problems, depression Sleep: Negative; No snoring, daytime sleepiness, hypersomnolence, bruxism, restless legs, hypnogognic hallucinations, no cataplexy Other  comprehensive 14 point system review is negative.   PE BP 113/74 (BP Location: Right Arm, Patient Position: Sitting, Cuff Size: Normal)   Pulse 73   Ht '5\' 7"'$  (1.702 m)   Wt 204 lb 9.6 oz (92.8 kg)   SpO2 98%   BMI 32.04 kg/m    Wt Readings from Last 3 Encounters:  11/22/15 204 lb 9.6 oz (92.8 kg)  09/06/15 202 lb (91.6 kg)  09/16/14 196 lb (88.9 kg)   General: Alert, oriented, no distress.  Skin: normal turgor, no rashes HEENT: Normocephalic, atraumatic. Pupils round and reactive; sclera anicteric;no lid lag.  Nose without nasal septal hypertrophy Mouth/Parynx benign; Mallinpatti scale 3 Neck: No JVD, no carotid bruits with normal carotid upstroke Lungs: clear to ausculatation and percussion; no wheezing or rales Chest wall: no tenderness to palpitation Heart: RRR, s1 s2 normal 1/6 systolic murmur; no diastolic or.  No S3 or S4 gallop.  No rubs thrills or heaves. Abdomen: Mild central adiposity; soft, nontender; no hepatosplenomehaly, BS+; abdominal aorta nontender and not dilated by palpation. Back: no CVA tenderness Pulses 2+ Extremities: no clubbing cyanosis or edema, Homan's sign negative  Neurologic: grossly nonfocal Psychologic: normal affect and mood.  ECG (independently read by me): Normal sinus rhythm at 73 bpm.  Nonspecific ST changes.  QTc interval 420 ms.  ECG (independently read by me): Normal sinus rhythm at 69 bpm.  No ectopy.  Normal intervals.  No significant ST segment changes  June 2015 ECG (independently read by me): Normal sinus rhythm at 63 beats per minute.  Small Q-wave in lead 3.  Normal intervals.  Prior December 2014 ECG: Sinus rhythm at 69 beats per minute. Normal intervals. No ectopy.  LABS: BMP Latest Ref Rng & Units 09/07/2015 07/01/2014  Glucose 65 - 99 mg/dL 99 92  BUN 7 - 25 mg/dL 10 11  Creatinine 0.70 - 1.25 mg/dL 0.90 0.80  Sodium 135 - 146 mmol/L 137 132(L)  Potassium 3.5 - 5.3 mmol/L 4.6 4.5  Chloride 98 - 110 mmol/L 102 98  CO2 20  -  31 mmol/L 24 23  Calcium 8.6 - 10.3 mg/dL 9.2 9.4   Hepatic Function Latest Ref Rng & Units 09/07/2015 07/01/2014  Total Protein 6.1 - 8.1 g/dL 6.6 6.8  Albumin 3.6 - 5.1 g/dL 4.3 4.5  AST 10 - 35 U/L 23 23  ALT 9 - 46 U/L 21 19  Alk Phosphatase 40 - 115 U/L 58 60  Total Bilirubin 0.2 - 1.2 mg/dL 0.7 0.9   CBC Latest Ref Rng & Units 09/07/2015 02/11/2015 07/01/2014  WBC 3.8 - 10.8 K/uL 5.7 6.8 10.9(A)  Hemoglobin 13.2 - 17.1 g/dL 15.9 - 15.9  Hematocrit 38.5 - 50.0 % 47.5 46.6 47.7  Platelets 140 - 400 K/uL 136(L) 144(L) -   Lab Results  Component Value Date   MCV 89.6 09/07/2015   MCV 90 02/11/2015   MCV 87.2 07/01/2014   Lab Results  Component Value Date   TSH 1.93 09/07/2015     Lipid Panel     Component Value Date/Time   CHOL 111 (L) 09/07/2015 1035   TRIG 136 09/07/2015 1035   HDL 21 (L) 09/07/2015 1035   CHOLHDL 5.3 (H) 09/07/2015 1035   VLDL 27 09/07/2015 1035   LDLCALC 63 09/07/2015 1035   RADIOLOGY: No results found.    ASSESSMENT AND PLAN: Mr. 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 Subsequent nuclear studies have shown normal perfusion. His last study was in 2011.  When I saw him last year, I titrated his Toprol to 50 mg and had recommended a subsequent stress test which he never had.  Presently, he remains asymptomatic with reference to chest pain or shortness of breath.  He has gained weight and he relates that this is due to his recent inability to exercise due to his pulled Achilles tendon.  His blood pressure today is controlled on amlodipine 5 mg, losartan 25 mg, Toprol-XL 50 mg.  He has a history of significant mixed hyperlipidemia, previous documentation of significant increased small LDL particles.  He is on rosuvastatin 40 mg, niacin 500 mg, and is on an over-the-counter version of a vascepa which is  exclusive EPA omega-3 fatty acid.  He has been taking self prescribed nattokinase and believes that this has significantly improved his symptoms.  I have reviewed his recent blood work.  Lipid studies are slightly improved from last year.  He never had his nuclear perfusion study.  He would prefer to defer this if possible.  As result, I discussed with him if he notices any change in symptomatology with chest pressure or exertional dyspnea.  Subsequent testing should be performed.  His BMI is increased to 32.04 and is consistent with mild obesity.  I discussed the possibility of exercising with a stationary bike rather than walking or possibly swim.  His hemoglobin A1c is excellent at 5.3.  As long as he remains stable, I will see him in one year for reevaluation.  Time spent: 25 minutes Troy Sine, MD, [facility]  11/22/2015 6:02 PM

## 2015-11-22 NOTE — Telephone Encounter (Signed)
Called patient per Dr Tresa Endokelly and asked him to decrease his aspirin down from 325 mg to 81 mg daily. Call back if  Questions.

## 2015-11-22 NOTE — Patient Instructions (Addendum)
Your physician wants you to follow-up in: AUGUST 2018. You will receive a reminder letter in the mail two months in advance. If you don't receive a letter, please call our office to schedule the follow-up appointment.  Your physician recommends that you return for lab work in AUGUST 2018    If you need a refill on your cardiac medications before your next appointment, please call your pharmacy.

## 2016-01-18 ENCOUNTER — Ambulatory Visit (INDEPENDENT_AMBULATORY_CARE_PROVIDER_SITE_OTHER): Payer: 59 | Admitting: Family Medicine

## 2016-01-18 VITALS — BP 112/74 | HR 97 | Temp 97.8°F | Resp 16 | Ht 68.0 in | Wt 204.0 lb

## 2016-01-18 DIAGNOSIS — Z23 Encounter for immunization: Secondary | ICD-10-CM | POA: Diagnosis not present

## 2016-01-18 DIAGNOSIS — M722 Plantar fascial fibromatosis: Secondary | ICD-10-CM | POA: Diagnosis not present

## 2016-01-18 MED ORDER — PREDNISONE 20 MG PO TABS: ORAL_TABLET | ORAL | 0 refills | Status: DC

## 2016-01-18 NOTE — Patient Instructions (Addendum)
Heel cups --- can purchase at drugstore.    IF you received an x-ray today, you will receive an invoice from [facility]. Please contact [facility] at [phone] with questions or concerns regarding your invoice.   IF you received labwork today, you will receive an invoice from [facility]. Please contact [facility] at [phone] with questions or concerns regarding your invoice.   Our billing staff will not be able to assist you with questions regarding bills from these companies.  You will be contacted with the lab results as soon as they are available. The fastest way to get your results is to activate your My Chart account. Instructions are located on the last page of this paperwork. If you have not heard from us regarding the results in 2 weeks, please contact this office.     Plantar Fasciitis Rehab Ask your health care provider which exercises are safe for you. Do exercises exactly as told by your health care provider and adjust them as directed. It is normal to feel mild stretching, pulling, tightness, or discomfort as you do these exercises, but you should stop right away if you feel sudden pain or your pain gets worse. Do not begin these exercises until told by your health care provider. Stretching and range of motion exercises These exercises warm up your muscles and joints and improve the movement and flexibility of your foot. These exercises also help to relieve pain. Exercise A: Plantar fascia stretch 1. Sit with your left / right leg crossed over your opposite knee. 2. Hold your heel with one hand with that thumb near your arch. With your other hand, hold your toes and gently pull them back toward the top of your foot. You should feel a stretch on the bottom of your toes or your foot or both. 3. Hold this stretch for__________ seconds. 4. Slowly release your toes and return to the starting position. Repeat __________ times. Complete this exercise __________  times a day. Exercise B: Gastroc, standing 1. Stand with your hands against a wall. 2. Extend your left / right leg behind you, and bend your front knee slightly. 3. Keeping your heels on the floor and keeping your back knee straight, shift your weight toward the wall without arching your back. You should feel a gentle stretch in your left / right calf. 4. Hold this position for __________ seconds. Repeat __________ times. Complete this exercise __________ times a day. Exercise C: Soleus, standing 1. Stand with your hands against a wall. 2. Extend your left / right leg behind you, and bend your front knee slightly. 3. Keeping your heels on the floor, bend your back knee and slightly shift your weight over the back leg. You should feel a gentle stretch deep in your calf. 4. Hold this position for __________ seconds. Repeat __________ times. Complete this exercise __________ times a day. Exercise D: Gastrocsoleus, standing 1. Stand with the ball of your left / right foot on a step. The ball of your foot is on the walking surface, right under your toes. 2. Keep your other foot firmly on the same step. 3. Hold onto the wall or a railing for balance. 4. Slowly lift your other foot, allowing your body weight to press your heel down over the edge of the step. You should feel a stretch in your left / right calf. 5. Hold this position for __________ seconds. 6. Return both feet to the step. 7. Repeat this exercise with a slight bend in your left /  right knee. Repeat __________ times with your left / right knee straight and __________ times with your left / right knee bent. Complete this exercise __________ times a day. Balance exercise This exercise builds your balance and strength control of your arch to help take pressure off your plantar fascia. Exercise E: Single leg stand 1. Without shoes, stand near a railing or in a doorway. You may hold onto the railing or door frame as needed. 2. Stand on  your left / right foot. Keep your big toe down on the floor and try to keep your arch lifted. Do not let your foot roll inward. 3. Hold this position for __________ seconds. 4. If this exercise is too easy, you can try it with your eyes closed or while standing on a pillow. Repeat __________ times. Complete this exercise __________ times a day. This information is not intended to replace advice given to you by your health care provider. Make sure you discuss any questions you have with your health care provider. Document Released: 01/12/2005 Document Revised: 09/17/2015 Document Reviewed: 11/26/2014 Elsevier Interactive Patient Education  2017 [employer]

## 2016-01-18 NOTE — Progress Notes (Signed)
Subjective:    Patient ID: Harold Newman, male    DOB: Nov 29, 1955, 60 y.o.   MRN: [id number]  01/18/2016  Foot Pain   HPI This 60 y.o. male presents for evaluation of RIGHT heel pain. Two months ago, injured Achille's tendon; [place]; walked five miles and then longer.  Prolonged walking in old pair of shoes.  Improved. Returned recently for two weeks.  Icing heel alternating with heat.  Review of Systems  Constitutional: Negative for chills, diaphoresis, fatigue and fever.  Musculoskeletal: Positive for arthralgias and gait problem. Negative for back pain, joint swelling and myalgias.    Past Medical History:  Diagnosis Date  . Bipolar disorder ([facility])   . CAD (coronary artery disease)   . GERD (gastroesophageal reflux disease)   . Hyperlipidemia   . Hypertension   . Irritable bowel syndrome (IBS)    Past Surgical History:  Procedure Laterality Date  . APPENDECTOMY  10/2000  . CARDIAC CATHETERIZATION  03/17/2005   low-normal LV function, 2 vessel CAD (subtotal occlusion of LAD, stenosis of small 2nd diagonal and with mid LAD narrowing, total occlusion of distal Cfx); PTCA and stent of LAD and 2nd diagonal with 3.5x5118mm Cypher DES (Dr. Nicki Guadalajarahomas Kelly)  . CARDIAC CATHETERIZATION  04/14/2005   2.5x2428mm Cypher DES to L Cfx (Dr. Nicki Guadalajarahomas Kelly)  . COLONOSCOPY  08/2011  . NM MYOCAR PERF WALL MOTION  2011   bruce myoview - normal pattern of perfusion, low risk  . TRANSTHORACIC ECHOCARDIOGRAM  2007   mild MR & TR, AV mildly sclerotic   Allergies  Allergen Reactions  . Anticoagulant Compound     PLAVIX: Other reaction(s): Other (See Comments) "Bleeding issues"  . Clopidogrel Other (See Comments)    "Bleeding issues"  . Meloxicam Nausea Only    Social History   Social History  . Marital status: Married    Spouse name: N/A  . Number of children: 3  . Years of education: 2316   Occupational History  . Not on file.   Social History Main Topics  . Smoking status:  Never Smoker  . Smokeless tobacco: Never Used  . Alcohol use Not on file  . Drug use: Unknown  . Sexual activity: Not on file   Other Topics Concern  . 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 Aplastic anemia Mother   . Hypertension Mother   . Heart failure Father   . Heart disease Father     CHF  . Heart attack Maternal Grandfather   . Hypertension Brother   . Parkinson's disease Brother   . Brain cancer Brother     tumor       Objective:    BP 112/74 (BP Location: Right Arm, Patient Position: Sitting, Cuff Size: Normal)   Pulse 97   Temp 97.8 F (36.6 C) (Oral)   Resp 16   Ht 5\' 8"  (1.727 m)   Wt 204 lb (92.5 kg)   SpO2 97%   BMI 31.02 kg/m  Physical Exam  Constitutional: He  is oriented to person, place, and time. He appears well-developed and well-nourished. No distress.  HENT:  Head: Normocephalic and atraumatic.  Eyes: Conjunctivae and EOM are normal. Pupils are equal, round, and reactive to light.  Neck: Normal range of motion. Neck supple. Carotid bruit is not present. No thyromegaly present.  Cardiovascular: Normal rate, regular rhythm, normal heart sounds and intact distal pulses.  Exam reveals no gallop and no friction rub.   No murmur heard. Pulmonary/Chest: Effort normal and breath sounds normal. He has no wheezes. He has no rales.  Musculoskeletal:       Right ankle: Normal. He exhibits normal range of motion. No tenderness. Achilles tendon normal.       Right lower leg: Normal. He exhibits no tenderness, no bony tenderness, no swelling, no edema, no deformity and no laceration.       Right foot: Normal. There is normal range of motion, no  tenderness, no bony tenderness, no swelling, normal capillary refill and no crepitus.  Lymphadenopathy:    He has no cervical adenopathy.  Neurological: He is alert and oriented to person, place, and time. No cranial nerve deficit.  Skin: Skin is warm and dry. No rash noted. He is not diaphoretic.  Psychiatric: He has a normal mood and affect. His behavior is normal.  Nursing note and vitals reviewed.       Assessment & Plan:   1. Plantar fasciitis of right foot   2. Need for prophylactic vaccination and inoculation against influenza    -New; rx for Prednisone provided; -rest, ice, elevate, supportive shoe, heel cups. -home exercise program provided. -if no improvement in 2-4 weeks, call for podiatry referral. -s/p flu vaccine.   Orders Placed This Encounter  Procedures  . Flu Vaccine QUAD 36+ mos IM   Meds ordered this encounter  Medications  . DISCONTD: predniSONE (DELTASONE) 20 MG tablet    Sig: Take 3 PO QAM x 1 day, 2 PO QAM x 5 days, 1 PO QAM x 5 days    Dispense:  18 tablet    Refill:  0  . DISCONTD: predniSONE (DELTASONE) 20 MG tablet    Sig: Take 3 PO QAM x 1 day, 2 PO QAM x 5 days, 1 PO QAM x 5 days    Dispense:  18 tablet    Refill:  0  . predniSONE (DELTASONE) 20 MG tablet    Sig: Take 3 PO QAM x 1 day, 2 PO QAM x 5 days, 1 PO QAM x 5 days    Dispense:  18 tablet    Refill:  0    No Follow-up on file.   Arushi Partridge Paulita FujitaMartin Evelynn Hench, M.D. [facility]  [place] [address] [place], [place]  [id number] [phone] phone [phone] fax

## 2016-02-03 ENCOUNTER — Encounter: Payer: Self-pay | Admitting: Family Medicine

## 2016-06-15 ENCOUNTER — Ambulatory Visit (INDEPENDENT_AMBULATORY_CARE_PROVIDER_SITE_OTHER): Payer: 59 | Admitting: Cardiovascular Disease

## 2016-06-15 ENCOUNTER — Encounter: Payer: Self-pay | Admitting: Cardiovascular Disease

## 2016-06-15 VITALS — BP 115/82 | HR 67 | Ht 68.0 in | Wt 205.4 lb

## 2016-06-15 DIAGNOSIS — I10 Essential (primary) hypertension: Secondary | ICD-10-CM

## 2016-06-15 DIAGNOSIS — I2510 Atherosclerotic heart disease of native coronary artery without angina pectoris: Secondary | ICD-10-CM

## 2016-06-15 DIAGNOSIS — E785 Hyperlipidemia, unspecified: Secondary | ICD-10-CM

## 2016-06-15 DIAGNOSIS — I2583 Coronary atherosclerosis due to lipid rich plaque: Secondary | ICD-10-CM | POA: Diagnosis not present

## 2016-06-15 DIAGNOSIS — E669 Obesity, unspecified: Secondary | ICD-10-CM | POA: Diagnosis not present

## 2016-06-15 MED ORDER — AMLODIPINE BESYLATE 5 MG PO TABS: 5.0000 mg | ORAL_TABLET | Freq: Every day | ORAL | 3 refills | Status: DC

## 2016-06-15 MED ORDER — ROSUVASTATIN CALCIUM 40 MG PO TABS: 40.0000 mg | ORAL_TABLET | Freq: Every day | ORAL | 2 refills | Status: DC

## 2016-06-15 MED ORDER — NITROSTAT 0.4 MG SL SUBL: SUBLINGUAL_TABLET | SUBLINGUAL | 4 refills | Status: DC

## 2016-06-15 MED ORDER — METOPROLOL SUCCINATE ER 50 MG PO TB24: 50.0000 mg | ORAL_TABLET | Freq: Every day | ORAL | 3 refills | Status: DC

## 2016-06-15 MED ORDER — LOSARTAN POTASSIUM 25 MG PO TABS
25.0000 mg | ORAL_TABLET | Freq: Every day | ORAL | 3 refills | Status: DC
Start: 2016-06-15 — End: 2017-06-17

## 2016-06-15 NOTE — Progress Notes (Signed)
Patient ID: Harold Newman, male   DOB: 12/07/1955, 61 y.o.   MRN: [id number]      HPI: Harold Newman is a 61 y.o. male who presents to the office today for a 7 month follow-up cardiology evaluation.    Mr. Harold Newman has a history of significant hyperlipidemia with markedly elevated small LDL small particles in the past greater than 3000.  In 2007 he was found to have subtotal LAD stenosis as well as total occlusion of the circumflex vessel with collaterals after a nuclear study demonstrated multiple areas of ischemia. He underwent initial intervention to his LAD totally and underwent successful staged intervention to his chronically occluded circumflex vessel with reestablishment of antegrade flow. His last nuclear perfusion study in 2011 continued to show fairly normal perfusion without scar or ischemia.  He has done exceptionally well since that intervention.  However, in February 2015, he noticed a change in symptomatology with a decline in exercise tolerance with exertional dyspnea and also has noticed some mild episodes of chest pain, which is seen to be mild, but similar in quality to his prior discomfort.  I had seen him last year and recommended a subsequent nuclear perfusion study.  He never had this done.  He did have some issues with Plavix in the past causing GI bleeding issues.  He now has been taking nattokinase which he states has alleviated a lot of his symptomatology.  He denies any overt recent GI bleed.  He recently had a URI infection which ultimately cleared after 46 weeks.  During this time, he was not using his regularly he has had in the past.  Laboratory in 2016 revealed a total cholesterol of 123, but his HDL was very low at 20, and his calculated LDL was 75.  In the past.he was noted to have markedly elevated small LDL particles in the setting of very low HDL.  In August 2017 follow-up blood work  showed slight improvement in his lipid status with total cholesterol now 111,  triglycerides 136, HDL 21, VLDL 27, and LDL 63 on his regimen consisting of Crestor 40 mg and he had resumed taking niacin 500 mg as well as fish oil.  He has been taking the full dose aspirin to reduce potential niacin flush.  However, I have suggested he reduce his aspirin to 81 mg.    Over the past year, he has continued to do well from a cardiac standpoint.  He specifically denies any exertional chest pain symptomatology.  At times there is some very mild shortness of breath.  He tells me he had blood work several months ago.  He is also scheduled to undergo a biometric screen assessment in June through [place] [facility].  He denies any palpitations.  He continues to be on Crestor 40 mg, niacin 500 mg, coenzyme Q10, and omega-3 fatty acids for his lipids.  He also takes l-carnitine.  He is on Toprol-XL 50 mg, losartan 25 mg daily, and amlodipine 5 mg for blood pressure and his CAD.  He presents for evaluation  Past Medical History:  Diagnosis Date  . Bipolar disorder (HCC)   . CAD (coronary artery disease)   . GERD (gastroesophageal reflux disease)   . Hyperlipidemia   . Hypertension   . Irritable bowel syndrome (IBS)     Past Surgical History:  Procedure Laterality Date  . APPENDECTOMY  10/2000  . CARDIAC CATHETERIZATION  03/17/2005   low-normal LV function, 2 vessel CAD (subtotal occlusion of LAD, stenosis of  small 2nd diagonal and with mid LAD narrowing, total occlusion of distal Cfx); PTCA and stent of LAD and 2nd diagonal with 3.5x65m Cypher DES (Dr. TShelva Majestic  . CARDIAC CATHETERIZATION  04/14/2005   2.5x25mCypher DES to L Cfx (Dr. ThShelva Majestic . COLONOSCOPY  08/2011  . NM MYOCAR PERF WALL MOTION  2011   bruce myoview - normal pattern of perfusion, low risk  . TRANSTHORACIC ECHOCARDIOGRAM  2007   mild MR & TR, AV mildly sclerotic    Allergies  Allergen Reactions  . Anticoagulant Compound     PLAVIX: Other reaction(s): Other (See Comments) "Bleeding issues"  .  Clopidogrel Other (See Comments)    "Bleeding issues"  . Meloxicam Nausea Only    Current Outpatient Prescriptions  Medication Sig Dispense Refill  . amLODipine (NORVASC) 5 MG tablet Take 1 tablet (5 mg total) by mouth daily. 90 tablet 3  . aspirin EC 81 MG tablet Take 1 tablet (81 mg total) by mouth daily. 90 tablet 3  . co-enzyme Q-10 30 MG capsule Take 200 mg by mouth daily.    . Cyanocobalamin (VITAMIN B-12 PO) Take 5,000 mg by mouth daily. 5000 i.u.     . Marland Kitchen-Ribose POWD 5 g by Does not apply route daily.    . Marland Kitchenicyclomine (BENTYL) 10 MG capsule Take 1 capsule (10 mg total) by mouth 3 (three) times daily before meals. 60 capsule 2  . lansoprazole (PREVACID) 15 MG capsule Take 30 mg by mouth daily.     . LevOCARNitine L-Tartrate (L-CARNITINE) 500 MG CAPS Take 500 mg by mouth daily.    . Marland KitchenORazepam (ATIVAN) 1 MG tablet Take 1 mg by mouth as needed.     . Marland Kitchenosartan (COZAAR) 25 MG tablet Take 1 tablet (25 mg total) by mouth daily. 90 tablet 3  . metoprolol succinate (TOPROL XL) 50 MG 24 hr tablet Take 1 tablet (50 mg total) by mouth daily. Take with or immediately following a meal. 90 tablet 3  . Multiple Vitamins-Minerals (MULTIVITAMIN WITH MINERALS) tablet Take 1 tablet by mouth daily.    . Marland KitchenAFTIN 2 % CREA Apply 1 application topically as needed.    . Marland KitchenATTOKINASE PO Take 2,000 mg by mouth.     . niacin (NIASPAN) 500 MG CR tablet Take 1 tablet (500 mg total) by mouth at bedtime. 90 tablet 3  . NITROSTAT 0.4 MG SL tablet PLACE ONE TABLET UNDER THE TONGUE EVERY FIVE MINUTES AS NEEDED FOR CHEST PAIN x 3 doses 25 tablet 4  . Omega-3 Fatty Acids (FISH OIL PO) Take 1 g by mouth daily.     . rosuvastatin (CRESTOR) 40 MG tablet Take 1 tablet (40 mg total) by mouth daily. 90 tablet 2  . tadalafil (CIALIS) 10 MG tablet Take 10 mg by mouth daily as needed for erectile dysfunction.    . valACYclovir (VALTREX) 500 MG tablet Take 500 mg by mouth as needed.     No current facility-administered medications  for this visit.     Social History   Social History  . Marital status: Married    Spouse name: N/A  . Number of children: 3  . Years of education: 1610 Occupational History  . Not on file.   Social History Main Topics  . Smoking status: Never Smoker  . Smokeless tobacco: Never Used  . Alcohol use Not on file  . Drug use: Unknown  . Sexual activity: Not on file   Other Topics Concern  .  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 He has 3 children 2 grandchildren. No tobacco or alcohol use.  Family History  Problem Relation Age of Onset  . Aplastic anemia Mother   . Hypertension Mother   . Heart failure Father   . Heart disease Father        CHF  . Heart attack Maternal Grandfather   . Hypertension Brother   . Parkinson's disease Brother   . Brain cancer Brother        tumor    ROS General: Negative; No fevers, chills, or night sweats;  HEENT: Negative; No changes in vision or hearing, sinus congestion, difficulty swallowing Pulmonary: Negative; No cough, wheezing, shortness of breath, hemoptysis Cardiovascular: See HPI;  GI: Positive for GERD; No nausea, vomiting, diarrhea, or abdominal pain GU: Negative; No dysuria, hematuria, or difficulty voiding Musculoskeletal: Negative; no myalgias, joint pain, or weakness Hematologic/Oncology: Negative; no easy bruising, bleeding Endocrine: Negative; no heat/cold intolerance; no diabetes Neuro: Negative; no changes in balance, headaches Skin: Negative; No rashes or skin lesions Psychiatric: Positive for bipolar disorder, stable on medical therapy No behavioral problems, depression Sleep: Negative; No  snoring, daytime sleepiness, hypersomnolence, bruxism, restless legs, hypnogognic hallucinations, no cataplexy Other comprehensive 14 point system review is negative.   PE BP 115/82   Pulse 67   Ht '5\' 8"'$  (1.727 m)   Wt 205 lb 6.4 oz (93.2 kg)   BMI 31.23 kg/m    Repeat blood pressure by me was 118/80  Wt Readings from Last 3 Encounters:  06/15/16 205 lb 6.4 oz (93.2 kg)  01/18/16 204 lb (92.5 kg)  11/22/15 204 lb 9.6 oz (92.8 kg)   General: Alert, oriented, no distress.  Skin: normal turgor, no rashes, warm and dry HEENT: Normocephalic, atraumatic. Pupils equal round and reactive to light; sclera anicteric; extraocular muscles intact;  Nose without nasal septal hypertrophy Mouth/Parynx benign; Mallinpatti scale 3 Neck: No JVD, no carotid bruits; normal carotid upstroke Lungs: clear to ausculatation and percussion; no wheezing or rales Chest wall: without tenderness to palpitation Heart: PMI not displaced, RRR, s1 s2 normal, 1/6 systolic murmur, no diastolic murmur, no rubs, gallops, thrills, or heaves Abdomen: Mild central adiposity; soft, nontender; no hepatosplenomehaly, BS+; abdominal aorta nontender and not dilated by palpation. Back: no CVA tenderness Pulses 2+ Musculoskeletal: full range of motion, normal strength, no joint deformities Extremities: no clubbing cyanosis or edema, Homan's sign negative  Neurologic: grossly nonfocal; Cranial nerves grossly wnl Psychologic: Normal mood and affect; normal cognition   ECG (independently read by me): Normal sinus rhythm at 67 bpm.  PR interval 186 ms, QTc interval 420 ms.  No syncope in ST-T changes.  October 2017 ECG (independently read by me): Normal sinus rhythm at 73 bpm.  Nonspecific ST changes.  QTc interval 420 ms.  ECG (independently read by me): Normal sinus rhythm at 69 bpm.  No ectopy.  Normal intervals.  No significant ST segment changes  June 2015 ECG (independently read by me): Normal sinus rhythm at 63 beats  per minute.  Small Q-wave in lead 3.  Normal intervals.  Prior December 2014 ECG: Sinus rhythm at 69 beats per minute. Normal intervals. No ectopy.  LABS: BMP Latest Ref Rng & Units 09/07/2015 07/01/2014  Glucose 65 - 99 mg/dL 99 92  BUN 7 - 25 mg/dL 10 11  Creatinine 0.70 - 1.25 mg/dL 0.90 0.80  Sodium 135 - 146 mmol/L 137 132(L)  Potassium 3.5 - 5.3 mmol/L 4.6 4.5  Chloride 98 - 110 mmol/L 102 98  CO2 20 - 31 mmol/L 24 23  Calcium 8.6 - 10.3 mg/dL 9.2 9.4   Hepatic Function Latest Ref Rng & Units 09/07/2015 07/01/2014  Total Protein 6.1 - 8.1 g/dL 6.6 6.8  Albumin 3.6 - 5.1 g/dL 4.3 4.5  AST 10 - 35 U/L 23 23  ALT 9 - 46 U/L 21 19  Alk Phosphatase 40 - 115 U/L 58 60  Total Bilirubin 0.2 - 1.2 mg/dL 0.7 0.9   CBC Latest Ref Rng & Units 09/07/2015 02/11/2015 07/01/2014  WBC 3.8 - 10.8 K/uL 5.7 6.8 10.9(A)  Hemoglobin 13.2 - 17.1 g/dL 15.9 - 15.9  Hematocrit 38.5 - 50.0 % 47.5 46.6 47.7  Platelets 140 - 400 K/uL 136(L) 144(L) -   Lab Results  Component Value Date   MCV 89.6 09/07/2015   MCV 90 02/11/2015   MCV 87.2 07/01/2014   Lab Results  Component Value Date   TSH 1.93 09/07/2015     Lipid Panel     Component Value Date/Time   CHOL 111 (L) 09/07/2015 1035   TRIG 136 09/07/2015 1035   HDL 21 (L) 09/07/2015 1035   CHOLHDL 5.3 (H) 09/07/2015 1035   VLDL 27 09/07/2015 1035   LDLCALC 63 09/07/2015 1035   RADIOLOGY: No results found.  IMPRESSION:  1. Coronary artery disease due to lipid rich plaque   2. Essential hypertension   3. Hyperlipidemia with target LDL less than 70   4. Mild obesity     ASSESSMENT AND PLAN: Mr. Chi Garlow is a 61 year old Caucasian male who is 11 years s/p two-vessel intervention to a subtotal LAD and chronic total occlusion of the circumflex vessel with successful reperfusion in 2007. Subsequent nuclear studies have shown normal perfusion with his last study in 2011.  I titrated his Toprol to 50 mg in 2016 and had recommended a subsequent  stress test which he never had.  When I saw him last year he remained asymptomatic with reference to chest pain or shortness of breath.  His blood pressure today is controlled on amlodipine 5 mg, losartan 25 mg, Toprol-XL 50 mg.  He has a history of significant mixed hyperlipidemia and previously was documented to have  significant increased small LDL particles.  He is on rosuvastatin 40 mg, niacin 500 mg, and is on an over-the-counter version of a vascepa which is exclusive EPA omega-3 fatty acid.  He had taken self prescribed nattokinase and believes that this has significantly improved his symptoms.  He tells me that he had blood work done 2-3 months ago and will be undergoing a comprehensive biometric screening assessment next month.  He is not having anginal symptomatology.  He is now on aspirin 81 mg.  PMI is mildly increased and consistent with obesity at 31.23.  Weight loss, increased exercise was recommended.  I am scheduling him for a follow-up exercise Myoview study to be done in 6 months.  Prior to his next office visit.  At that time, it will be 7 years since  his last evaluation.  I will see him in follow-up and further recommendations were made at that time.  Time spent: 25 minutes Troy Sine, MD, [facility]  06/16/2016 6:19 PM

## 2016-06-15 NOTE — Patient Instructions (Signed)
Your physician recommends that you schedule a follow-up appointment and exercise myoview in November.

## 2016-06-18 ENCOUNTER — Telehealth: Payer: Self-pay | Admitting: Cardiovascular Disease

## 2016-06-18 NOTE — Telephone Encounter (Signed)
Called the patient and left a VM to call back to schedule his stress test in November.

## 2016-06-25 ENCOUNTER — Telehealth: Payer: Self-pay | Admitting: Cardiovascular Disease

## 2016-06-25 NOTE — Telephone Encounter (Signed)
Called the patient and left a VM for him to call back and schedule his stress test.

## 2016-06-27 ENCOUNTER — Ambulatory Visit (INDEPENDENT_AMBULATORY_CARE_PROVIDER_SITE_OTHER): Payer: 59 | Admitting: Family Medicine

## 2016-06-27 ENCOUNTER — Encounter: Payer: Self-pay | Admitting: Family Medicine

## 2016-06-27 VITALS — BP 119/79 | HR 70 | Temp 97.0°F | Resp 16 | Ht 67.25 in | Wt 203.0 lb

## 2016-06-27 DIAGNOSIS — E785 Hyperlipidemia, unspecified: Secondary | ICD-10-CM | POA: Diagnosis not present

## 2016-06-27 DIAGNOSIS — I2510 Atherosclerotic heart disease of native coronary artery without angina pectoris: Secondary | ICD-10-CM | POA: Diagnosis not present

## 2016-06-27 DIAGNOSIS — Z131 Encounter for screening for diabetes mellitus: Secondary | ICD-10-CM

## 2016-06-27 DIAGNOSIS — Z0000 Encounter for general adult medical examination without abnormal findings: Secondary | ICD-10-CM

## 2016-06-27 DIAGNOSIS — L2084 Intrinsic (allergic) eczema: Secondary | ICD-10-CM | POA: Diagnosis not present

## 2016-06-27 DIAGNOSIS — E6609 Other obesity due to excess calories: Secondary | ICD-10-CM | POA: Diagnosis not present

## 2016-06-27 DIAGNOSIS — Z125 Encounter for screening for malignant neoplasm of prostate: Secondary | ICD-10-CM | POA: Diagnosis not present

## 2016-06-27 DIAGNOSIS — F3170 Bipolar disorder, currently in remission, most recent episode unspecified: Secondary | ICD-10-CM | POA: Diagnosis not present

## 2016-06-27 DIAGNOSIS — R0602 Shortness of breath: Secondary | ICD-10-CM | POA: Diagnosis not present

## 2016-06-27 DIAGNOSIS — I10 Essential (primary) hypertension: Secondary | ICD-10-CM

## 2016-06-27 DIAGNOSIS — Z6831 Body mass index (BMI) 31.0-31.9, adult: Secondary | ICD-10-CM

## 2016-06-27 DIAGNOSIS — K219 Gastro-esophageal reflux disease without esophagitis: Secondary | ICD-10-CM

## 2016-06-27 DIAGNOSIS — I2583 Coronary atherosclerosis due to lipid rich plaque: Secondary | ICD-10-CM

## 2016-06-27 DIAGNOSIS — Z23 Encounter for immunization: Secondary | ICD-10-CM | POA: Diagnosis not present

## 2016-06-27 LAB — POCT URINALYSIS DIP (MANUAL ENTRY)
BILIRUBIN UA: NEGATIVE mg/dL
Bilirubin, UA: NEGATIVE
GLUCOSE UA: NEGATIVE mg/dL
Leukocytes, UA: NEGATIVE
Nitrite, UA: NEGATIVE
Protein Ur, POC: NEGATIVE mg/dL
UROBILINOGEN UA: 0.2 U/dL
pH, UA: 5.5 (ref 5.0–8.0)

## 2016-06-27 MED ORDER — LORAZEPAM 1 MG PO TABS: 0.5000 mg | ORAL_TABLET | ORAL | 5 refills | Status: DC | PRN

## 2016-06-27 NOTE — Progress Notes (Signed)
Subjective:    Patient ID: Harold Newman, male    DOB: [birth year], 61 y.o.   MRN: [id number]  06/27/2016  Annual Exam   HPI This 61 y.o. male presents for Complete Physical Examination.  Last physical:  08-2015 Colonoscopy:  8/82013; colon polyp; repeat 5 years. PSA:  2017 Eye exam: 2016; reading glasses.  No g/c/md. Dental exam:  Once per year.  BP Readings from Last 3 Encounters:  06/27/16 119/79  06/15/16 115/82  01/18/16 112/74   Wt Readings from Last 3 Encounters:  06/27/16 203 lb (92.1 kg)  06/15/16 205 lb 6.4 oz (93.2 kg)  01/18/16 204 lb (92.5 kg)   Immunization History  Administered Date(s) Administered  . Influenza,inj,Quad PF,36+ Mos 01/18/2016  . Td 06/27/2016     Review of Systems  Constitutional: Negative for activity change, appetite change, chills, diaphoresis, fatigue, fever and unexpected weight change.  HENT: Negative for congestion, dental problem, drooling, ear discharge, ear pain, facial swelling, hearing loss, mouth sores, nosebleeds, postnasal drip, rhinorrhea, sinus pressure, sneezing, sore throat, tinnitus, trouble swallowing and voice change.   Eyes: Negative for photophobia, pain, discharge, redness, itching and visual disturbance.  Respiratory: Negative for apnea, cough, choking, chest tightness, shortness of breath, wheezing and stridor.   Cardiovascular: Negative for chest pain, palpitations and leg swelling.  Gastrointestinal: Negative for abdominal pain, blood in stool, constipation, diarrhea, nausea and vomiting.  Endocrine: Negative for cold intolerance, heat intolerance, polydipsia, polyphagia and polyuria.  Genitourinary: Negative for decreased urine volume, difficulty urinating, discharge, dysuria, enuresis, flank pain, frequency, genital sores, hematuria, penile pain, penile swelling, scrotal swelling, testicular pain and urgency.  Musculoskeletal: Negative for arthralgias, back pain, gait problem, joint swelling, myalgias, neck pain  and neck stiffness.  Skin: Negative for color change, pallor, rash and wound.  Allergic/Immunologic: Negative for environmental allergies, food allergies and immunocompromised state.  Neurological: Negative for dizziness, tremors, seizures, syncope, facial asymmetry, speech difficulty, weakness, light-headedness, numbness and headaches.  Hematological: Negative for adenopathy. Does not bruise/bleed easily.  Psychiatric/Behavioral: Negative for agitation, behavioral problems, confusion, decreased concentration, dysphoric mood, hallucinations, self-injury, sleep disturbance and suicidal ideas. The patient is not nervous/anxious and is not hyperactive.     Past Medical History:  Diagnosis Date  . Bipolar disorder (HCC)   . CAD (coronary artery disease)   . GERD (gastroesophageal reflux disease)   . Hyperlipidemia   . Hypertension   . Irritable bowel syndrome (IBS)    Past Surgical History:  Procedure Laterality Date  . APPENDECTOMY  10/2000  . CARDIAC CATHETERIZATION  03/17/2005   low-normal LV function, 2 vessel CAD (subtotal occlusion of LAD, stenosis of small 2nd diagonal and with mid LAD narrowing, total occlusion of distal Cfx); PTCA and stent of LAD and 2nd diagonal with 3.5x68mm Cypher DES (Dr. Nicki Guadalajara)  . CARDIAC CATHETERIZATION  04/14/2005   2.5x46mm Cypher DES to L Cfx (Dr. Nicki Guadalajara)  . COLONOSCOPY  08/2011  . NM MYOCAR PERF WALL MOTION  2011   bruce myoview - normal pattern of perfusion, low risk  . TRANSTHORACIC ECHOCARDIOGRAM  2007   mild MR & TR, AV mildly sclerotic   Allergies  Allergen Reactions  . Anticoagulant Compound     PLAVIX: Other reaction(s): Other (See Comments) "Bleeding issues"  . Clopidogrel Other (See Comments)    "Bleeding issues"  . Meloxicam Nausea Only    Social History   Social History  . Marital status: Married    Spouse name: N/A  . Number of children:  3  . Years of education: 43   Occupational History  . Analyst  [employer]   Social History Main Topics  . Smoking status: Never Smoker  . Smokeless tobacco: Never Used  . Alcohol use 0.6 oz/week    1 Glasses of wine per week     Comment: rarely   . Drug use: No  . Sexual activity: Yes   Other Topics Concern  . 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      Exercise:  Walking 3 times per week 1-2 miles      Seatbelt: 100%; no texting while driving.   Family History  Problem Relation Age of Onset  . Aplastic anemia Mother   . Hypertension Mother   . Heart failure Father   . Heart disease Father        CHF  . Alcohol abuse Father   . Heart attack Maternal Grandfather   . Brain cancer Brother   . Alcohol abuse Brother   . Gout Brother   . Hypertension Brother   . Parkinson's disease Brother   . Cancer Brother 71       brain tumor       Objective:    BP 119/79   Pulse 70   Temp 97 F (36.1 C) (Oral)   Resp 16   Ht 5' 7.25" (1.708 m)   Wt 203 lb (92.1 kg)   SpO2 94%   BMI 31.56 kg/m  Physical Exam  Constitutional: He is oriented to person, place, and time. He appears well-developed and well-nourished. No distress.  HENT:  Head: Normocephalic and atraumatic.  Right Ear: External ear normal.  Left Ear: External ear normal.  Nose: Nose normal.  Mouth/Throat: Oropharynx is clear and moist.  Eyes: Conjunctivae and EOM are normal. Pupils are equal, round, and reactive to light.  Neck: Normal range of motion. Neck supple. Carotid bruit is not present. No thyromegaly present.  Cardiovascular: Normal rate, regular rhythm, normal heart sounds and intact distal pulses.  Exam reveals no gallop and no friction rub.   No murmur heard. Pulmonary/Chest: Effort normal and  breath sounds normal. He has no wheezes. He has no rales.  Abdominal: Soft. Bowel sounds are normal. He exhibits no distension and no mass. There is no tenderness. There is no rebound and no guarding. Hernia confirmed negative in the right inguinal area and confirmed negative in the left inguinal area.  Genitourinary: Testes normal and penis normal.  Musculoskeletal:       Right shoulder: Normal.       Left shoulder: Normal.       Cervical back: Normal.  Lymphadenopathy:    He has no cervical adenopathy.       Right: No inguinal adenopathy present.       Left: No inguinal adenopathy present.  Neurological: He is alert and oriented to person, place, and time. He has normal reflexes. No cranial nerve deficit. He exhibits normal muscle tone. Coordination normal.  Skin: Skin is warm and dry. No rash noted. He is not diaphoretic.  Psychiatric: He has a normal mood and  affect. His behavior is normal. Judgment and thought content normal.   Results for orders placed or performed in visit on 06/27/16  CBC with Differential/Platelet  Result Value Ref Range   WBC 7.8 3.4 - 10.8 x10E3/uL   RBC 5.69 4.14 - 5.80 x10E6/uL   Hemoglobin 16.8 13.0 - 17.7 g/dL   Hematocrit 16.1 09.6 - 51.0 %   MCV 90 79 - 97 fL   MCH 29.5 26.6 - 33.0 pg   MCHC 32.9 31.5 - 35.7 g/dL   RDW 04.5 40.9 - 81.1 %   Platelets 156 150 - 379 x10E3/uL   Neutrophils 57 Not Estab. %   Lymphs 32 Not Estab. %   Monocytes 7 Not Estab. %   Eos 3 Not Estab. %   Basos 1 Not Estab. %   Neutrophils Absolute 4.5 1.4 - 7.0 x10E3/uL   Lymphocytes Absolute 2.5 0.7 - 3.1 x10E3/uL   Monocytes Absolute 0.5 0.1 - 0.9 x10E3/uL   EOS (ABSOLUTE) 0.2 0.0 - 0.4 x10E3/uL   Basophils Absolute 0.1 0.0 - 0.2 x10E3/uL   Immature Granulocytes 0 Not Estab. %   Immature Grans (Abs) 0.0 0.0 - 0.1 x10E3/uL  Comprehensive metabolic panel  Result Value Ref Range   Glucose 87 65 - 99 mg/dL   BUN 11 8 - 27 mg/dL   Creatinine, Ser 9.14 0.76 - 1.27 mg/dL    GFR calc non Af Amer 84 >59 mL/min/1.73   GFR calc Af Amer 97 >59 mL/min/1.73   BUN/Creatinine Ratio 11 10 - 24   Sodium 139 134 - 144 mmol/L   Potassium 4.4 3.5 - 5.2 mmol/L   Chloride 100 96 - 106 mmol/L   CO2 24 18 - 29 mmol/L   Calcium 9.6 8.6 - 10.2 mg/dL   Total Protein 7.3 6.0 - 8.5 g/dL   Albumin 4.6 3.6 - 4.8 g/dL   Globulin, Total 2.7 1.5 - 4.5 g/dL   Albumin/Globulin Ratio 1.7 1.2 - 2.2   Bilirubin Total 0.7 0.0 - 1.2 mg/dL   Alkaline Phosphatase 63 39 - 117 IU/L   AST 27 0 - 40 IU/L   ALT 31 0 - 44 IU/L  Hemoglobin A1c  Result Value Ref Range   Hgb A1c MFr Bld 5.6 4.8 - 5.6 %   Est. average glucose Bld gHb Est-mCnc 114 mg/dL  Lipid panel  Result Value Ref Range   Cholesterol, Total 120 100 - 199 mg/dL   Triglycerides 782 0 - 149 mg/dL   HDL 28 (L) >95 mg/dL   VLDL Cholesterol Cal 29 5 - 40 mg/dL   LDL Calculated 63 0 - 99 mg/dL   Chol/HDL Ratio 4.3 0.0 - 5.0 ratio  PSA  Result Value Ref Range   Prostate Specific Ag, Serum 1.3 0.0 - 4.0 ng/mL  TSH  Result Value Ref Range   TSH 2.710 0.450 - 4.500 uIU/mL  POCT urinalysis dipstick  Result Value Ref Range   Color, UA yellow yellow   Clarity, UA clear clear   Glucose, UA negative negative mg/dL   Bilirubin, UA negative negative   Ketones, POC UA negative negative mg/dL   Spec Grav, UA <=6.213 (A) 1.010 - 1.025   Blood, UA trace-lysed (A) negative   pH, UA 5.5 5.0 - 8.0   Protein Ur, POC negative negative mg/dL   Urobilinogen, UA 0.2 0.2 or 1.0 E.U./dL   Nitrite, UA Negative Negative   Leukocytes, UA Negative Negative   Depression screen [facility] 2/9 06/27/2016 01/18/2016 09/06/2015 09/16/2014 07/01/2014  Decreased Interest 0 0 0 0 0  Down, Depressed, Hopeless 0 0 0 0 0  PHQ - 2 Score 0 0 0 0 0  Altered sleeping 0 - - - -  Tired, decreased energy 0 - - - -  Change in appetite 0 - - - -  Feeling bad or failure about yourself  0 - - - -  Trouble concentrating 0 - - - -  Moving slowly or fidgety/restless 0 - - - -    Suicidal thoughts 0 - - - -  PHQ-9 Score 0 - - - -   Fall Risk  06/27/2016 01/18/2016 09/06/2015  Falls in the past year? No No No       Assessment & Plan:   1. Routine physical examination   2. Coronary artery disease due to lipid rich plaque   3. Essential hypertension   4. Intrinsic eczema   5. Bipolar affective disorder in remission (HCC)   6. Hyperlipidemia with target LDL less than 70   7. Screening for diabetes mellitus   8. Screening for prostate cancer   9. Need for prophylactic vaccination with combined diphtheria-tetanus-pertussis (DTP) vaccine   10. Gastroesophageal reflux disease without esophagitis   11. Class 1 obesity due to excess calories with serious comorbidity and body mass index (BMI) of 31.0 to 31.9 in adult   12. Exertional shortness of breath    -anticipatory guidance provided --- exercise, weight loss, safe driving practices, aspirin 81mg  daily. -obtain age appropriate screening labs and labs for chronic disease management. -refills provided. -followed by cardiology annually for CAD; asymptomatic. -s/p Tetanus; pt refused TDAP and shingrix; no history of chicken pox. -no longer followed by psychiatry; requesting Xanax for bedtime use; advised against daily use and advised of negative long term effects of benzos.  Orders Placed This Encounter  Procedures  . Td vaccine greater than or equal to 7yo preservative free IM  . CBC with Differential/Platelet  . Comprehensive metabolic panel    Order Specific Question:   Has the patient fasted?    Answer:   Yes  . Hemoglobin A1c  . Lipid panel    Order Specific Question:   Has the patient fasted?    Answer:   Yes  . PSA  . TSH  . POCT urinalysis dipstick   Meds ordered this encounter  Medications  . albuterol (PROVENTIL HFA;VENTOLIN HFA) 108 (90 Base) MCG/ACT inhaler    Sig: Inhale 2 puffs into the lungs every 6 (six) hours as needed for wheezing or shortness of breath.  Marland Kitchen. LORazepam (ATIVAN) 1 MG tablet     Sig: Take 0.5-1 tablets (0.5-1 mg total) by mouth as needed.    Dispense:  30 tablet    Refill:  5    Return in about 1 year (around 06/27/2017) for complete physical examiniation.   Kristi Paulita FujitaMartin Smith, M.D. [facility]  [place] [facility] [address] [place], [place]  [id number] [phone] phone [phone] fax

## 2016-06-27 NOTE — Patient Instructions (Addendum)
   IF you received an x-ray today, you will receive an invoice from [facility]. Please contact [facility] at [phone] with questions or concerns regarding your invoice.   IF you received labwork today, you will receive an invoice from [facility]. Please contact [facility] at [phone] with questions or concerns regarding your invoice.   Our billing staff will not be able to assist you with questions regarding bills from these companies.  You will be contacted with the lab results as soon as they are available. The fastest way to get your results is to activate your My Chart account. Instructions are located on the last page of this paperwork. If you have not heard from us regarding the results in 2 weeks, please contact this office.      Preventive Care 40-64 Years, Male Preventive care refers to lifestyle choices and visits with your health care provider that can promote health and wellness. What does preventive care include?  A yearly physical exam. This is also called an annual well check.  Dental exams once or twice a year.  Routine eye exams. Ask your health care provider how often you should have your eyes checked.  Personal lifestyle choices, including: ? Daily care of your teeth and gums. ? Regular physical activity. ? Eating a healthy diet. ? Avoiding tobacco and drug use. ? Limiting alcohol use. ? Practicing safe sex. ? Taking low-dose aspirin every day starting at age 50. What happens during an annual well check? The services and screenings done by your health care provider during your annual well check will depend on your age, overall health, lifestyle risk factors, and family history of disease. Counseling Your health care provider may ask you questions about your:  Alcohol use.  Tobacco use.  Drug use.  Emotional well-being.  Home and relationship well-being.  Sexual activity.  Eating habits.  Work and work  environment.  Screening You may have the following tests or measurements:  Height, weight, and BMI.  Blood pressure.  Lipid and cholesterol levels. These may be checked every 5 years, or more frequently if you are over 50 years old.  Skin check.  Lung cancer screening. You may have this screening every year starting at age 55 if you have a 30-pack-year history of smoking and currently smoke or have quit within the past 15 years.  Fecal occult blood test (FOBT) of the stool. You may have this test every year starting at age 50.  Flexible sigmoidoscopy or colonoscopy. You may have a sigmoidoscopy every 5 years or a colonoscopy every 10 years starting at age 50.  Prostate cancer screening. Recommendations will vary depending on your family history and other risks.  Hepatitis C blood test.  Hepatitis B blood test.  Sexually transmitted disease (STD) testing.  Diabetes screening. This is done by checking your blood sugar (glucose) after you have not eaten for a while (fasting). You may have this done every 1-3 years.  Discuss your test results, treatment options, and if necessary, the need for more tests with your health care provider. Vaccines Your health care provider may recommend certain vaccines, such as:  Influenza vaccine. This is recommended every year.  Tetanus, diphtheria, and acellular pertussis (Tdap, Td) vaccine. You may need a Td booster every 10 years.  Varicella vaccine. You may need this if you have not been vaccinated.  Zoster vaccine. You may need this after age 60.  Measles, mumps, and rubella (MMR) vaccine. You may need at least one dose   of MMR if you were born in 1957 or later. You may also need a second dose.  Pneumococcal 13-valent conjugate (PCV13) vaccine. You may need this if you have certain conditions and have not been vaccinated.  Pneumococcal polysaccharide (PPSV23) vaccine. You may need one or two doses if you smoke cigarettes or if you have  certain conditions.  Meningococcal vaccine. You may need this if you have certain conditions.  Hepatitis A vaccine. You may need this if you have certain conditions or if you travel or work in places where you may be exposed to hepatitis A.  Hepatitis B vaccine. You may need this if you have certain conditions or if you travel or work in places where you may be exposed to hepatitis B.  Haemophilus influenzae type b (Hib) vaccine. You may need this if you have certain risk factors.  Talk to your health care provider about which screenings and vaccines you need and how often you need them. This information is not intended to replace advice given to you by your health care provider. Make sure you discuss any questions you have with your health care provider. Document Released: 02/08/2015 Document Revised: 10/02/2015 Document Reviewed: 11/13/2014 Elsevier Interactive Patient Education  2017 Elsevier Inc.  

## 2016-06-28 LAB — CBC WITH DIFFERENTIAL/PLATELET
BASOS ABS: 0.1 10*3/uL (ref 0.0–0.2)
Basos: 1 %
EOS (ABSOLUTE): 0.2 10*3/uL (ref 0.0–0.4)
Eos: 3 %
Hematocrit: 51 % (ref 37.5–51.0)
Hemoglobin: 16.8 g/dL (ref 13.0–17.7)
Immature Grans (Abs): 0 10*3/uL (ref 0.0–0.1)
Immature Granulocytes: 0 %
LYMPHS ABS: 2.5 10*3/uL (ref 0.7–3.1)
Lymphs: 32 %
MCH: 29.5 pg (ref 26.6–33.0)
MCHC: 32.9 g/dL (ref 31.5–35.7)
MCV: 90 fL (ref 79–97)
MONOS ABS: 0.5 10*3/uL (ref 0.1–0.9)
Monocytes: 7 %
NEUTROS ABS: 4.5 10*3/uL (ref 1.4–7.0)
Neutrophils: 57 %
PLATELETS: 156 10*3/uL (ref 150–379)
RBC: 5.69 x10E6/uL (ref 4.14–5.80)
RDW: 14.7 % (ref 12.3–15.4)
WBC: 7.8 10*3/uL (ref 3.4–10.8)

## 2016-06-28 LAB — LIPID PANEL
CHOL/HDL RATIO: 4.3 ratio (ref 0.0–5.0)
CHOLESTEROL TOTAL: 120 mg/dL (ref 100–199)
HDL: 28 mg/dL — ABNORMAL LOW (ref >39)
LDL CALC: 63 mg/dL (ref 0–99)
TRIGLYCERIDES: 145 mg/dL (ref 0–149)
VLDL Cholesterol Cal: 29 mg/dL (ref 5–40)

## 2016-06-28 LAB — PSA: Prostate Specific Ag, Serum: 1.3 ng/mL (ref 0.0–4.0)

## 2016-06-28 LAB — COMPREHENSIVE METABOLIC PANEL
A/G RATIO: 1.7 (ref 1.2–2.2)
ALK PHOS: 63 IU/L (ref 39–117)
ALT: 31 IU/L (ref 0–44)
AST: 27 IU/L (ref 0–40)
Albumin: 4.6 g/dL (ref 3.6–4.8)
BILIRUBIN TOTAL: 0.7 mg/dL (ref 0.0–1.2)
BUN/Creatinine Ratio: 11 (ref 10–24)
BUN: 11 mg/dL (ref 8–27)
CHLORIDE: 100 mmol/L (ref 96–106)
CO2: 24 mmol/L (ref 18–29)
Calcium: 9.6 mg/dL (ref 8.6–10.2)
Creatinine, Ser: 0.96 mg/dL (ref 0.76–1.27)
GFR calc non Af Amer: 84 mL/min/{1.73_m2} (ref >59)
GFR, EST AFRICAN AMERICAN: 97 mL/min/{1.73_m2} (ref >59)
GLUCOSE: 87 mg/dL (ref 65–99)
Globulin, Total: 2.7 g/dL (ref 1.5–4.5)
POTASSIUM: 4.4 mmol/L (ref 3.5–5.2)
Sodium: 139 mmol/L (ref 134–144)
Total Protein: 7.3 g/dL (ref 6.0–8.5)

## 2016-06-28 LAB — HEMOGLOBIN A1C
Est. average glucose Bld gHb Est-mCnc: 114 mg/dL
HEMOGLOBIN A1C: 5.6 % (ref 4.8–5.6)

## 2016-06-28 LAB — TSH: TSH: 2.71 u[IU]/mL (ref 0.450–4.500)

## 2016-07-11 ENCOUNTER — Telehealth: Payer: Self-pay | Admitting: Family Medicine

## 2016-07-11 NOTE — Telephone Encounter (Addendum)
[facility] WITH DR. Katrinka BlazingSMITH ON June 27, 2016. HE PROVIDED HER WITH A [employer] FORM THAT NEEDED TO BE FILLED OUT FOR HIS WORK. 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 WHEN HE CALLED HIS COMPANY THEY HAVE NOT RECEIVED IT BACK YET. HE BROUGHT OVER ANOTHER FORM (07/11/16) IN CASE THE 1st ONE HAS BEEN MISPLACED. BEST PHONE [phone] (HOME) PLEASE CALL HIM WHEN IT HAS BEEN FAXED BACK TO [facility]. MBC THE FORM HAS BEEN PLACED IN DR. SMITH'S BOX AT 102 AND ROUTED TO THE CLINICAL MESSAGE POOL.

## 2016-07-14 NOTE — Telephone Encounter (Signed)
[facility]. Confirmation fax received. Called patient and left message on vm that [facility]. Advised to call back with any questions.

## 2016-07-19 DIAGNOSIS — Z6831 Body mass index (BMI) 31.0-31.9, adult: Secondary | ICD-10-CM

## 2016-07-19 DIAGNOSIS — E6609 Other obesity due to excess calories: Secondary | ICD-10-CM | POA: Insufficient documentation

## 2016-08-01 ENCOUNTER — Ambulatory Visit: Payer: Self-pay | Admitting: Family Medicine

## 2016-08-18 ENCOUNTER — Other Ambulatory Visit: Payer: Self-pay | Admitting: Cardiovascular Disease

## 2016-08-19 ENCOUNTER — Other Ambulatory Visit: Payer: Self-pay

## 2016-09-26 ENCOUNTER — Ambulatory Visit (INDEPENDENT_AMBULATORY_CARE_PROVIDER_SITE_OTHER): Payer: 59 | Admitting: Family Medicine

## 2016-09-26 DIAGNOSIS — Z23 Encounter for immunization: Secondary | ICD-10-CM

## 2016-09-27 NOTE — Progress Notes (Signed)
Not seen by provider 

## 2016-11-07 ENCOUNTER — Ambulatory Visit (INDEPENDENT_AMBULATORY_CARE_PROVIDER_SITE_OTHER): Payer: 59

## 2016-11-07 ENCOUNTER — Ambulatory Visit (INDEPENDENT_AMBULATORY_CARE_PROVIDER_SITE_OTHER): Payer: 59 | Admitting: Family Medicine

## 2016-11-07 ENCOUNTER — Encounter: Payer: Self-pay | Admitting: Family Medicine

## 2016-11-07 VITALS — BP 110/64 | HR 84 | Temp 98.8°F | Resp 16 | Ht 67.25 in | Wt 206.0 lb

## 2016-11-07 DIAGNOSIS — J301 Allergic rhinitis due to pollen: Secondary | ICD-10-CM | POA: Diagnosis not present

## 2016-11-07 DIAGNOSIS — J069 Acute upper respiratory infection, unspecified: Secondary | ICD-10-CM | POA: Diagnosis not present

## 2016-11-07 DIAGNOSIS — J9801 Acute bronchospasm: Secondary | ICD-10-CM

## 2016-11-07 LAB — POCT CBC
GRANULOCYTE PERCENT: 83.7 % — AB (ref 37–80)
HEMATOCRIT: 48.6 % (ref 43.5–53.7)
Hemoglobin: 16.2 g/dL (ref 14.1–18.1)
Lymph, poc: 1.1 (ref 0.6–3.4)
MCH: 30.4 pg (ref 27–31.2)
MCHC: 33.3 g/dL (ref 31.8–35.4)
MCV: 91.3 fL (ref 80–97)
MID (cbc): 0.2 (ref 0–0.9)
MPV: 8.2 fL (ref 0–99.8)
PLATELET COUNT, POC: 167 10*3/uL (ref 142–424)
POC GRANULOCYTE: 7 — AB (ref 2–6.9)
POC LYMPH PERCENT: 13.6 %L (ref 10–50)
POC MID %: 2.7 %M (ref 0–12)
RBC: 5.32 M/uL (ref 4.69–6.13)
RDW, POC: 13.9 %
WBC: 8.4 10*3/uL (ref 4.6–10.2)

## 2016-11-07 NOTE — Progress Notes (Signed)
Subjective:    Patient ID: Harold Newman, male    DOB: 1955/02/16, 61 y.o.   MRN: [id number]  11/07/2016  Asthma (fmla )    HPI This 61 y.o. male presents for evaluation of recent illness.  Onset 10/14/16 developed URI symptoms; felt really drained; worked for three days.  [place]; seen at [facility] to be out for two days.  Needs FMLA paperwork for recent illness.  Company is not making pt do it. Job requires excessive talking.  Works in a call center.  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  Sick days per year up to four occurrence; three days for each occurrence.  Sixty days of FMLA.  Can be placed on corrective action if hits fourth occurrence.   Trying to be proactive.  Two years ago in June 2016.    Prescribed Prednisone, Albuterol, Phenergan DM cough syrup on 10/18/2016.  Still having some fatigue.  No SOB.  Still having some lethargy.  HSV outbreak recurrent L thigh; also will get one on chin. 75-80% improved.  Still having throat congestion.  Taking Alavert with good results.  Considered Flonase.  23, 24th September.    BP Readings from Last 3 Encounters:  11/07/16 110/64  06/27/16 119/79  06/15/16 115/82   Wt Readings from Last 3 Encounters:  11/07/16 206 lb (93.4 kg)  06/27/16 203 lb (92.1 kg)  06/15/16 205 lb 6.4 oz (93.2 kg)   Immunization History  Administered Date(s) Administered  . Influenza,inj,Quad PF,6+ Mos 01/18/2016, 09/26/2016  . Td 06/27/2016    Review of Systems  Constitutional: Positive for fatigue. Negative for activity change, appetite change, chills, diaphoresis and fever.  HENT: Positive for congestion and postnasal drip. Negative for rhinorrhea, sinus pain, sinus pressure, sore throat and trouble swallowing.   Respiratory: Positive for cough. Negative for shortness of breath and wheezing.   Cardiovascular: Negative for chest pain, palpitations and leg swelling.    Gastrointestinal: Negative for abdominal pain, diarrhea, nausea and vomiting.  Endocrine: Negative for cold intolerance, heat intolerance, polydipsia, polyphagia and polyuria.  Skin: Negative for color change, rash and wound.  Neurological: Negative for dizziness, tremors, seizures, syncope, facial asymmetry, speech difficulty, weakness, light-headedness, numbness and headaches.  Psychiatric/Behavioral: Negative for dysphoric mood and sleep disturbance. The patient is not nervous/anxious.     Past Medical History:  Diagnosis Date  . Bipolar disorder (HCC)   . CAD (coronary artery disease)   . GERD (gastroesophageal reflux disease)   . Hyperlipidemia   . Hypertension   . Irritable bowel syndrome (IBS)    Past Surgical History:  Procedure Laterality Date  . APPENDECTOMY  10/2000  . CARDIAC CATHETERIZATION  03/17/2005   low-normal LV function, 2 vessel CAD (subtotal occlusion of LAD, stenosis of small 2nd diagonal and with mid LAD narrowing, total occlusion of distal Cfx); PTCA and stent of LAD and 2nd diagonal with 3.5x30mm Cypher DES (Dr. Nicki Guadalajara)  . CARDIAC CATHETERIZATION  04/14/2005   2.5x53mm Cypher DES to L Cfx (Dr. Nicki Guadalajara)  . COLONOSCOPY  08/2011  . NM MYOCAR PERF WALL MOTION  2011   bruce myoview - normal pattern of perfusion, low risk  . TRANSTHORACIC ECHOCARDIOGRAM  2007   mild MR & TR, AV mildly sclerotic   Allergies  Allergen Reactions  . Anticoagulant Compound     PLAVIX: Other reaction(s): Other (See Comments) "Bleeding issues"  . Clopidogrel Other (See Comments)    "  Bleeding issues" "Bleeding issues" "Bleeding issues"  . Meloxicam Nausea Only   Current Outpatient Prescriptions on File Prior to Visit  Medication Sig Dispense Refill  . albuterol (PROVENTIL HFA;VENTOLIN HFA) 108 (90 Base) MCG/ACT inhaler Inhale 2 puffs into the lungs every 6 (six) hours as needed for wheezing or shortness of breath.    Marland Kitchen amLODipine (NORVASC) 5 MG tablet Take 1 tablet (5  mg total) by mouth daily. 90 tablet 3  . aspirin EC 81 MG tablet Take 1 tablet (81 mg total) by mouth daily. 90 tablet 3  . co-enzyme Q-10 30 MG capsule Take 200 mg by mouth daily.    . Cyanocobalamin (VITAMIN B-12 PO) Take 5,000 mg by mouth daily. 5000 i.u.     Marland Kitchen D-Ribose POWD 5 g by Does not apply route daily.    Marland Kitchen dicyclomine (BENTYL) 10 MG capsule Take 1 capsule (10 mg total) by mouth 3 (three) times daily before meals. 60 capsule 2  . LevOCARNitine L-Tartrate (L-CARNITINE) 500 MG CAPS Take 500 mg by mouth daily.    Marland Kitchen LORazepam (ATIVAN) 1 MG tablet Take 0.5-1 tablets (0.5-1 mg total) by mouth as needed. 30 tablet 5  . losartan (COZAAR) 25 MG tablet Take 1 tablet (25 mg total) by mouth daily. 90 tablet 3  . metoprolol succinate (TOPROL XL) 50 MG 24 hr tablet Take 1 tablet (50 mg total) by mouth daily. Take with or immediately following a meal. 90 tablet 3  . Multiple Vitamins-Minerals (MULTIVITAMIN WITH MINERALS) tablet Take 1 tablet by mouth daily.    Marland Kitchen NAFTIN 2 % CREA Apply 1 application topically as needed.    Marland Kitchen NATTOKINASE PO Take 2,000 mg by mouth.     . niacin (NIASPAN) 500 MG CR tablet Take 1 tablet (500 mg total) by mouth at bedtime. 90 tablet 3  . NITROSTAT 0.4 MG SL tablet PLACE ONE TABLET UNDER THE TONGUE EVERY FIVE MINUTES AS NEEDED FOR CHEST PAIN x 3 doses 25 tablet 4  . NITROSTAT 0.4 MG SL tablet DISSOLVE ONE TABLET UNDER THE TONGUE EVERY 5 MINUTES AS NEEDED FOR CHEST PAIN.  DO NOT EXCEED A TOTAL OF 3 DOSES IN 15 MINUTES 25 tablet 4  . Omega-3 Fatty Acids (FISH OIL PO) Take 1 g by mouth daily.     . rosuvastatin (CRESTOR) 40 MG tablet Take 1 tablet (40 mg total) by mouth daily. 90 tablet 2  . tadalafil (CIALIS) 10 MG tablet Take 10 mg by mouth daily as needed for erectile dysfunction.    . valACYclovir (VALTREX) 500 MG tablet Take 500 mg by mouth as needed.    . lansoprazole (PREVACID) 15 MG capsule Take 30 mg by mouth daily.      No current facility-administered medications on  file prior to visit.    Social History   Social History  . Marital status: Married    Spouse name: N/A  . Number of children: 3  . Years of education: 54   Occupational History  . Analyst  [employer]   Social History Main Topics  . Smoking status: Never Smoker  . Smokeless tobacco: Never Used  . Alcohol use 0.6 oz/week    1 Glasses of wine per week     Comment: rarely   . Drug use: No  . Sexual activity: Yes   Other Topics Concern  . 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      Exercise:  Walking 3 times per week 1-2 miles      Seatbelt: 100%; no texting while driving.   Family History  Problem Relation Age of Onset  . Aplastic anemia Mother   . Hypertension Mother   . Heart failure Father   . Heart disease Father        CHF  . Alcohol abuse Father   . Heart attack Maternal Grandfather   . Brain cancer Brother   . Alcohol abuse Brother   . Gout Brother   . Hypertension Brother   . Parkinson's disease Brother   . Cancer Brother 64       brain tumor       Objective:    BP 110/64   Pulse 84   Temp 98.8 F (37.1 C)   Resp 16   Ht 5' 7.25" (1.708 m)   Wt 206 lb (93.4 kg)   SpO2 95%   BMI 32.02 kg/m  Physical Exam  Constitutional: He is oriented to person, place, and time. He appears well-developed and well-nourished. No distress.  HENT:  Head: Normocephalic and atraumatic.  Right Ear: External ear normal.  Left Ear: External ear normal.  Nose: Nose normal.  Mouth/Throat: Oropharynx is clear and moist.  Eyes: Pupils are equal, round, and reactive to light. Conjunctivae and EOM are normal.  Neck: Normal range of motion. Neck supple. Carotid bruit is not present. No thyromegaly  present.  Cardiovascular: Normal rate, regular rhythm, normal heart sounds and intact distal pulses.  Exam reveals no gallop and no friction rub.   No murmur heard. Pulmonary/Chest: Effort normal and breath sounds normal. He has no wheezes. He has no rales.  Abdominal: Soft. Bowel sounds are normal. He exhibits no distension and no mass. There is no tenderness. There is no rebound and no guarding.  Lymphadenopathy:    He has no cervical adenopathy.  Neurological: He is alert and oriented to person, place, and time. No cranial nerve deficit.  Skin: Skin is warm and dry. No rash noted. He is not diaphoretic.  Psychiatric: He has a normal mood and affect. His behavior is normal.  Nursing note and vitals reviewed.  No results found. Depression screen [facility] 2/9 11/07/2016 06/27/2016 01/18/2016 09/06/2015 09/16/2014  Decreased Interest 0 0 0 0 0  Down, Depressed, Hopeless 0 0 0 0 0  PHQ - 2 Score 0 0 0 0 0  Altered sleeping - 0 - - -  Tired, decreased energy - 0 - - -  Change in appetite - 0 - - -  Feeling bad or failure about yourself  - 0 - - -  Trouble concentrating - 0 - - -  Moving slowly or fidgety/restless - 0 - - -  Suicidal thoughts - 0 - - -  PHQ-9 Score - 0 - - -   Fall Risk  11/07/2016 06/27/2016 01/18/2016 09/06/2015  Falls in the past year? No No No No    Results for orders placed or performed in visit on 11/07/16  POCT CBC  Result Value Ref Range   WBC 8.4 4.6 - 10.2 K/uL   Lymph, poc 1.1 0.6 - 3.4   POC LYMPH PERCENT 13.6  10 - 50 %L   MID (cbc) 0.2 0 - 0.9   POC MID % 2.7 0 - 12 %M   POC Granulocyte 7.0 (A) 2 - 6.9   Granulocyte percent 83.7 (A) 37 - 80 %G   RBC 5.32 4.69 - 6.13 M/uL   Hemoglobin 16.2 14.1 - 18.1 g/dL   HCT, POC 16.1 09.6 - 53.7 %   MCV 91.3 80 - 97 fL   MCH, POC 30.4 27 - 31.2 pg   MCHC 33.3 31.8 - 35.4 g/dL   RDW, POC 04.5 %   Platelet Count, POC 167 142 - 424 K/uL   MPV 8.2 0 - 99.8 fL       Assessment & Plan:   1. Acute upper respiratory  infection   2. Bronchospasm   3. Seasonal allergic rhinitis due to pollen     -New onset URI; evaluated at [facility]; missed two days of work.  Completed FMLA paperwork for these two days.  Suffering with ongoing fatigue and cough; CXR negative and CBC stable.  Continue with supportive care with Albuterol, oral antihistamine (Claritin, Zyrtec, Allegra) and Flonase.   -patient expressing concerns regarding more severe illnesses due to associated bronchospasm; noted in FMLA paperwork that he may suffer with bronchospasm twice yearly where he may miss 2 days of work with each illness.  Pt has 12 sick days per year, yet does not want to suffer corrective action from missed days.   -definitely does not warrant FMLA paperwork for this long term and expressed this to patient. Also advised patient that he does not want to utilize FMLA days unnecessarily.  Completed for this illness only.  Orders Placed This Encounter  Procedures  . DG Chest 2 View    Standing Status:   Future    Number of Occurrences:   1    Standing Expiration Date:   11/07/2017    Order Specific Question:   Reason for Exam (SYMPTOM  OR DIAGNOSIS REQUIRED)    Answer:   cough, fatigue for three weeks    Order Specific Question:   Preferred imaging location?    Answer:   External  . POCT CBC   No orders of the defined types were placed in this encounter.   No Follow-up on file.   Kevion Fatheree Paulita Fujita, M.D. Primary Care at [facility] [address] [place], [place]  [address] [phone] phone [phone] fax

## 2016-11-07 NOTE — Patient Instructions (Addendum)
Continue Alavert and Flonase for allergies.     IF you received an x-ray today, you will receive an invoice from [facility]. Please contact [facility] at [phone] with questions or concerns regarding your invoice.   IF you received labwork today, you will receive an invoice from [facility]. Please contact [facility] at [phone] with questions or concerns regarding your invoice.   Our billing staff will not be able to assist you with questions regarding bills from these companies.  You will be contacted with the lab results as soon as they are available. The fastest way to get your results is to activate your My Chart account. Instructions are located on the last page of this paperwork. If you have not heard from [place] regarding the results in 2 weeks, please contact this office.     Allergic Rhinitis Allergic rhinitis is when the mucous membranes in the nose respond to allergens. Allergens are particles in the air that cause your body to have an allergic reaction. This causes you to release allergic antibodies. Through a chain of events, these eventually cause you to release histamine into the blood stream. Although meant to protect the body, it is this release of histamine that causes your discomfort, such as frequent sneezing, congestion, and an itchy, runny nose. What are the causes? Seasonal allergic rhinitis (hay fever) is caused by pollen allergens that may come from grasses, trees, and weeds. Year-round allergic rhinitis (perennial allergic rhinitis) is caused by allergens such as house dust mites, pet dander, and mold spores. What are the signs or symptoms?  Nasal stuffiness (congestion).  Itchy, runny nose with sneezing and tearing of the eyes. How is this diagnosed? Your health care provider can help you determine the allergen or allergens that trigger your symptoms. If you and your health care provider are unable to determine the allergen, skin or blood  testing may be used. Your health care provider will diagnose your condition after taking your health history and performing a physical exam. Your health care provider may assess you for other related conditions, such as asthma, pink eye, or an ear infection. How is this treated? Allergic rhinitis does not have a cure, but it can be controlled by:  Medicines that block allergy symptoms. These may include allergy shots, nasal sprays, and oral antihistamines.  Avoiding the allergen.  Hay fever may often be treated with antihistamines in pill or nasal spray forms. Antihistamines block the effects of histamine. There are over-the-counter medicines that may help with nasal congestion and swelling around the eyes. Check with your health care provider before taking or giving this medicine. If avoiding the allergen or the medicine prescribed do not work, there are many new medicines your health care provider can prescribe. Stronger medicine may be used if initial measures are ineffective. Desensitizing injections can be used if medicine and avoidance does not work. Desensitization is when a patient is given ongoing shots until the body becomes less sensitive to the allergen. Make sure you follow up with your health care provider if problems continue. Follow these instructions at home: It is not possible to completely avoid allergens, but you can reduce your symptoms by taking steps to limit your exposure to them. It helps to know exactly what you are allergic to so that you can avoid your specific triggers. Contact a health care provider if:  You have a fever.  You develop a cough that does not stop easily (persistent).  You have shortness of breath.  You start  wheezing.  Symptoms interfere with normal daily activities. This information is not intended to replace advice given to you by your health care provider. Make sure you discuss any questions you have with your health care provider. Document  Released: 10/07/2000 Document Revised: 09/13/2015 Document Reviewed: 09/19/2012 Elsevier Interactive Patient Education  2017 [employer].

## 2016-11-10 DIAGNOSIS — J301 Allergic rhinitis due to pollen: Secondary | ICD-10-CM | POA: Insufficient documentation

## 2016-11-19 ENCOUNTER — Other Ambulatory Visit: Payer: Self-pay | Admitting: Family Medicine

## 2016-12-02 ENCOUNTER — Telehealth (HOSPITAL_COMMUNITY): Payer: Self-pay | Admitting: Cardiovascular Disease

## 2016-12-04 NOTE — Telephone Encounter (Signed)
User: Trina AoGRIFFIN, Tryce Surratt A Date/time: 12/02/16 3:05 PM  Comment: Called pt and lmsg to confirm that his myoview appt was cancelled and inquired if he wanted to reschedule.   Context:  Outcome: Left Message  Phone number: [phone] Phone Type: Home Phone  Comm. type: Telephone Call type: Outgoing  Contact: Armandina GemmaBrame, Richards A Relation to patient: Self

## 2016-12-14 DIAGNOSIS — Z0271 Encounter for disability determination: Secondary | ICD-10-CM

## 2016-12-16 ENCOUNTER — Inpatient Hospital Stay (HOSPITAL_COMMUNITY): Admission: RE | Admit: 2016-12-16 | Payer: 59 | Source: Ambulatory Visit

## 2016-12-25 ENCOUNTER — Telehealth: Payer: Self-pay | Admitting: Family Medicine

## 2016-12-25 NOTE — Telephone Encounter (Signed)
Copied from CRM [id number]. Topic: Quick Communication - See Telephone Encounter >> Dec 25, 2016  2:25 PM Oneal GroutSebastian, Jennifer S wrote: CRM for notification. See Telephone encounter for:  Requesting refill on metoprolol succinate (TOPROL XL) 50 MG 24 hr tablet, wants to pick up written script. 90 day supply 12/25/16.

## 2016-12-26 ENCOUNTER — Ambulatory Visit: Payer: Self-pay | Admitting: Family Medicine

## 2016-12-29 NOTE — Telephone Encounter (Signed)
Rx refill

## 2016-12-29 NOTE — Telephone Encounter (Signed)
Requesting a refill that needs to be printed.

## 2017-01-05 ENCOUNTER — Telehealth: Payer: Self-pay | Admitting: Family Medicine

## 2017-01-06 NOTE — Telephone Encounter (Signed)
Controlled substance 

## 2017-01-20 ENCOUNTER — Other Ambulatory Visit: Payer: Self-pay | Admitting: Family Medicine

## 2017-01-20 NOTE — Telephone Encounter (Signed)
Lorazepam refill. Last refill on 01/10/17.

## 2017-01-20 NOTE — Telephone Encounter (Signed)
Cathlean CowerAlesia from [facility] [phone] said they did not get a request for LORazepam (ATIVAN) 1 MG tablet  for the patient.

## 2017-01-21 NOTE — Telephone Encounter (Signed)
Called pts pharmacy to verify if they received the Rx sent in on 12/16 for her Lorazepam  and they informed me that they did not receive the Rx. Gave a verbal to refill Rx per Dr. Katrinka BlazingSmith. Called pt and informed pt the Rx should be ready to pick up today.

## 2017-01-22 NOTE — Telephone Encounter (Signed)
Called [facility].  Never received electronic request for Lorazepam on 01/10/17.  Medication/Lorazepam called in yesterday by Primary Care at [facility].

## 2017-04-19 ENCOUNTER — Other Ambulatory Visit: Payer: Self-pay

## 2017-04-19 ENCOUNTER — Ambulatory Visit (INDEPENDENT_AMBULATORY_CARE_PROVIDER_SITE_OTHER): Payer: 59 | Admitting: Family Medicine

## 2017-04-19 ENCOUNTER — Encounter: Payer: Self-pay | Admitting: Family Medicine

## 2017-04-19 VITALS — BP 120/78 | HR 74 | Temp 98.4°F | Resp 18 | Ht 68.11 in | Wt 207.6 lb

## 2017-04-19 DIAGNOSIS — I2583 Coronary atherosclerosis due to lipid rich plaque: Secondary | ICD-10-CM

## 2017-04-19 DIAGNOSIS — I2510 Atherosclerotic heart disease of native coronary artery without angina pectoris: Secondary | ICD-10-CM

## 2017-04-19 DIAGNOSIS — J301 Allergic rhinitis due to pollen: Secondary | ICD-10-CM

## 2017-04-19 DIAGNOSIS — Z131 Encounter for screening for diabetes mellitus: Secondary | ICD-10-CM

## 2017-04-19 DIAGNOSIS — E785 Hyperlipidemia, unspecified: Secondary | ICD-10-CM | POA: Diagnosis not present

## 2017-04-19 DIAGNOSIS — I10 Essential (primary) hypertension: Secondary | ICD-10-CM | POA: Diagnosis not present

## 2017-04-19 DIAGNOSIS — K582 Mixed irritable bowel syndrome: Secondary | ICD-10-CM

## 2017-04-19 DIAGNOSIS — K219 Gastro-esophageal reflux disease without esophagitis: Secondary | ICD-10-CM

## 2017-04-19 DIAGNOSIS — E6609 Other obesity due to excess calories: Secondary | ICD-10-CM

## 2017-04-19 DIAGNOSIS — Z0000 Encounter for general adult medical examination without abnormal findings: Secondary | ICD-10-CM

## 2017-04-19 DIAGNOSIS — L2084 Intrinsic (allergic) eczema: Secondary | ICD-10-CM | POA: Diagnosis not present

## 2017-04-19 DIAGNOSIS — Z125 Encounter for screening for malignant neoplasm of prostate: Secondary | ICD-10-CM | POA: Diagnosis not present

## 2017-04-19 DIAGNOSIS — F3170 Bipolar disorder, currently in remission, most recent episode unspecified: Secondary | ICD-10-CM

## 2017-04-19 DIAGNOSIS — Z6830 Body mass index (BMI) 30.0-30.9, adult: Secondary | ICD-10-CM

## 2017-04-19 LAB — POCT URINALYSIS DIP (MANUAL ENTRY)
BILIRUBIN UA: NEGATIVE
BILIRUBIN UA: NEGATIVE mg/dL
GLUCOSE UA: NEGATIVE mg/dL
LEUKOCYTES UA: NEGATIVE
NITRITE UA: NEGATIVE
Protein Ur, POC: NEGATIVE mg/dL
Spec Grav, UA: 1.015 (ref 1.010–1.025)
Urobilinogen, UA: 0.2 E.U./dL
pH, UA: 6 (ref 5.0–8.0)

## 2017-04-19 MED ORDER — TADALAFIL 20 MG PO TABS: 10.0000 mg | ORAL_TABLET | Freq: Every day | ORAL | 0 refills | Status: AC | PRN

## 2017-04-19 MED ORDER — VALACYCLOVIR HCL 500 MG PO TABS: 500.0000 mg | ORAL_TABLET | Freq: Every day | ORAL | 3 refills | Status: AC

## 2017-04-19 MED ORDER — METOPROLOL SUCCINATE ER 50 MG PO TB24: 50.0000 mg | ORAL_TABLET | Freq: Every day | ORAL | 3 refills | Status: DC

## 2017-04-19 MED ORDER — AMLODIPINE BESYLATE 5 MG PO TABS
5.0000 mg | ORAL_TABLET | Freq: Every day | ORAL | 3 refills | Status: DC
Start: 2017-04-19 — End: 2017-05-26

## 2017-04-19 MED ORDER — DICYCLOMINE HCL 10 MG PO CAPS: ORAL_CAPSULE | ORAL | 2 refills | Status: AC

## 2017-04-19 MED ORDER — ROSUVASTATIN CALCIUM 40 MG PO TABS: 40.0000 mg | ORAL_TABLET | Freq: Every day | ORAL | 3 refills | Status: DC

## 2017-04-19 MED ORDER — LORAZEPAM 1 MG PO TABS: 0.5000 mg | ORAL_TABLET | Freq: Every day | ORAL | 4 refills | Status: AC | PRN

## 2017-04-19 MED ORDER — NITROSTAT 0.4 MG SL SUBL: SUBLINGUAL_TABLET | SUBLINGUAL | 4 refills | Status: DC

## 2017-04-19 NOTE — Progress Notes (Signed)
Subjective:    Patient ID: Harold Newman, male    DOB: [birth year], 62 y.o.   MRN: [id number]  04/19/2017  Annual Exam    HPI This 62 y.o. male presents for COMPLETE PHYSICAL EXAMINATION.  Last physical:  06-2016 Colonoscopy:  08-2011; repeat 5 years; plans to wait until Medicare. PSA:   06-2016  NO CHICKEN POX.   Visual Acuity Screening   Right eye Left eye Both eyes  Without correction: 20/20 20/20 20/20   With correction:      BP Readings from Last 3 Encounters:  04/19/17 120/78  11/07/16 110/64  06/27/16 119/79   Wt Readings from Last 3 Encounters:  04/19/17 207 lb 9.6 oz (94.2 kg)  11/07/16 206 lb (93.4 kg)  06/27/16 203 lb (92.1 kg)   Immunization History  Administered Date(s) Administered  . Influenza,inj,Quad PF,6+ Mos 01/18/2016, 09/26/2016  . Td 06/27/2016   Health Maintenance  Topic Date Due  . INFLUENZA VACCINE  08/26/2017  . COLONOSCOPY  09/02/2021  . TETANUS/TDAP  06/28/2026  . Hepatitis C Screening  Completed  . HIV Screening  Completed   Review of Systems  Constitutional: Negative for activity change, appetite change, chills, diaphoresis, fatigue, fever and unexpected weight change.  HENT: Negative for congestion, dental problem, drooling, ear discharge, ear pain, facial swelling, hearing loss, mouth sores, nosebleeds, postnasal drip, rhinorrhea, sinus pressure, sneezing, sore throat, tinnitus, trouble swallowing and voice change.   Eyes: Negative for photophobia, pain, discharge, redness, itching and visual disturbance.  Respiratory: Negative for apnea, cough, choking, chest tightness, shortness of breath, wheezing and stridor.   Cardiovascular: Negative for chest pain, palpitations and leg swelling.  Gastrointestinal: Negative for abdominal pain, blood in stool, constipation, diarrhea, nausea and vomiting.  Endocrine: Negative for cold intolerance, heat intolerance, polydipsia, polyphagia and polyuria.  Genitourinary: Negative for decreased urine  volume, difficulty urinating, discharge, dysuria, enuresis, flank pain, frequency, genital sores, hematuria, penile pain, penile swelling, scrotal swelling, testicular pain and urgency.       Nocturia x 1.  Urinary stream is strong.  Musculoskeletal: Negative for arthralgias, back pain, gait problem, joint swelling, myalgias, neck pain and neck stiffness.  Skin: Negative for color change, pallor, rash and wound.  Allergic/Immunologic: Negative for environmental allergies, food allergies and immunocompromised state.  Neurological: Negative for dizziness, tremors, seizures, syncope, facial asymmetry, speech difficulty, weakness, light-headedness, numbness and headaches.  Hematological: Negative for adenopathy. Does not bruise/bleed easily.  Psychiatric/Behavioral: Negative for agitation, behavioral problems, confusion, decreased concentration, dysphoric mood, hallucinations, self-injury, sleep disturbance and suicidal ideas. The patient is not nervous/anxious and is not hyperactive.        Bedtime [phone]; wakes up at 600.    Past Medical History:  Diagnosis Date  . Bipolar disorder (HCC)   . CAD (coronary artery disease)   . GERD (gastroesophageal reflux disease)   . Hyperlipidemia   . Hypertension   . Irritable bowel syndrome (IBS)    Past Surgical History:  Procedure Laterality Date  . APPENDECTOMY  10/2000  . CARDIAC CATHETERIZATION  03/17/2005   low-normal LV function, 2 vessel CAD (subtotal occlusion of LAD, stenosis of small 2nd diagonal and with mid LAD narrowing, total occlusion of distal Cfx); PTCA and stent of LAD and 2nd diagonal with 3.5x57mm Cypher DES (Dr. Nicki Guadalajara)  . CARDIAC CATHETERIZATION  04/14/2005   2.5x67mm Cypher DES to L Cfx (Dr. Nicki Guadalajara)  . COLONOSCOPY  08/2011  . NM MYOCAR PERF WALL MOTION  2011   bruce myoview - normal  pattern of perfusion, low risk  . TRANSTHORACIC ECHOCARDIOGRAM  2007   mild MR & TR, AV mildly sclerotic   Allergies  Allergen  Reactions  . Anticoagulant Compound     PLAVIX: Other reaction(s): Other (See Comments) "Bleeding issues"  . Clopidogrel Other (See Comments)    "Bleeding issues" "Bleeding issues" "Bleeding issues"  . Meloxicam Nausea Only   Current Outpatient Medications on File Prior to Visit  Medication Sig Dispense Refill  . albuterol (PROVENTIL HFA;VENTOLIN HFA) 108 (90 Base) MCG/ACT inhaler Inhale 2 puffs into the lungs every 6 (six) hours as needed for wheezing or shortness of breath.    Marland Kitchen aspirin EC 81 MG tablet Take 1 tablet (81 mg total) by mouth daily. 90 tablet 3  . co-enzyme Q-10 30 MG capsule Take 200 mg by mouth daily.    . Cyanocobalamin (VITAMIN B-12 PO) Take 5,000 mg by mouth daily. 5000 i.u.     Marland Kitchen D-Ribose POWD 5 g by Does not apply route daily.    . lansoprazole (PREVACID) 15 MG capsule Take 30 mg by mouth daily.     . LevOCARNitine L-Tartrate (L-CARNITINE) 500 MG CAPS Take 500 mg by mouth daily.    . Multiple Vitamins-Minerals (MULTIVITAMIN WITH MINERALS) tablet Take 1 tablet by mouth daily.    Marland Kitchen NAFTIN 2 % CREA Apply 1 application topically as needed.    Marland Kitchen NATTOKINASE PO Take 2,000 mg by mouth.     . Omega-3 Fatty Acids (FISH OIL PO) Take 1 g by mouth daily.      No current facility-administered medications on file prior to visit.    Social History   Socioeconomic History  . Marital status: Married    Spouse name: Not on file  . Number of children: 3  . Years of education: 74  . Highest education level: Not on file  Occupational History  . 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 Financial resource strain: Not on file  . Food insecurity:    Worry: Not on file    Inability: Not on file  . Transportation needs:    Medical: Not on file    Non-medical: Not on file  Tobacco Use  . Smoking status: Never Smoker  . Smokeless tobacco: Never Used  Substance and Sexual Activity  . Alcohol use: Yes    Alcohol/week: 0.6 oz    Types: 1 Glasses of  wine per week    Comment: rarely   . Drug use: No  . Sexual activity: Yes  Lifestyle  . Physical activity:    Days per week: Not on file    Minutes per session: Not on file  . Stress: Not on file  Relationships  . Social connections:    Talks on phone: Not on file    Gets together: Not on file    Attends religious service: Not on file    Active member of club or organization: Not on file    Attends meetings of clubs or organizations: Not on file    Relationship status: Not on file  . Intimate partner violence:    Fear of current or ex partner: Not on file    Emotionally abused: Not on file    Physically abused: Not on file    Forced sexual activity: Not on file  Other Topics Concern  . 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      Exercise:  Walking 3 times per week 1-2 miles      Seatbelt: 100%; no texting while driving.   Family History  Problem Relation Age of Onset  . Aplastic anemia Mother   . Hypertension Mother   . Heart failure Father   . Heart disease Father        CHF  . Alcohol abuse Father   . Heart attack Maternal Grandfather   . Brain cancer Brother   . Alcohol abuse Brother   . Gout Brother   . Hypertension Brother   . Parkinson's disease Brother   . Cancer Brother [id number]       brain tumor       Objective:    BP 120/78 (BP Location: Left Arm, Patient Position: Sitting, Cuff Size: Normal)   Pulse 74   Temp 98.4 F (36.9 C) (Oral)   Resp 18   Ht 5' 8.11" (1.73 m)   Wt 207 lb 9.6 oz (94.2 kg)   SpO2 96%   BMI 31.46 kg/m  Physical Exam  Constitutional: He is oriented to person, place, and time. He appears well-developed and well-nourished. No distress.  HENT:  Head: Normocephalic  and atraumatic.  Right Ear: External ear normal.  Left Ear: External ear normal.  Nose: Nose normal.  Mouth/Throat: Oropharynx is clear and moist.  Eyes: Pupils are equal, round, and reactive to light. Conjunctivae and EOM are normal.  Neck: Normal range of motion. Neck supple. Carotid bruit is not present. No thyromegaly present.  Cardiovascular: Normal rate, regular rhythm, normal heart sounds and intact distal pulses. Exam reveals no gallop and no friction rub.  No murmur heard. Pulmonary/Chest: Effort normal and breath sounds normal. He has no wheezes. He has no rales.  Abdominal: Soft. Bowel sounds are normal. He exhibits no distension and no mass. There is no tenderness. There is no rebound and no guarding.  Genitourinary: Penis normal.  Musculoskeletal:       Right shoulder: Normal.       Left shoulder: Normal.       Cervical back: Normal.  Lymphadenopathy:    He has no cervical adenopathy.  Neurological: He is alert and oriented to person, place, and time. He has normal reflexes. No cranial nerve deficit. He exhibits normal muscle tone. Coordination normal.  Skin: Skin is warm and dry. No rash noted. He is not diaphoretic.  Psychiatric: He has a normal mood and affect. His behavior is normal. Judgment and thought content normal.   No results found. Depression screen [facility] 04/19/2017 11/07/2016 06/27/2016 01/18/2016 09/06/2015  Decreased Interest 0 0 0 0 0  Down, Depressed, Hopeless 0 0 0 0 0  PHQ - 2 Score 0 0 0 0 0  Altered sleeping - - 0 - -  Tired, decreased energy - - 0 - -  Change in appetite - - 0 - -  Feeling bad or failure about yourself  - - 0 - -  Trouble concentrating - - 0 - -  Moving slowly or fidgety/restless - - 0 - -  Suicidal thoughts - - 0 - -  PHQ-9 Score - - 0 - -   Fall Risk  04/19/2017 11/07/2016  06/27/2016 01/18/2016 09/06/2015  Falls in the past year? No No No No No        Assessment & Plan:   1. Routine physical examination   2. Hyperlipidemia  with target LDL less than 70   3. Gastroesophageal reflux disease without esophagitis   4. Irritable bowel syndrome with both constipation and diarrhea   5. Intrinsic eczema   6. Bipolar affective disorder in remission (HCC)   7. Screening for prostate cancer   8. Screening for diabetes mellitus   9. Class 1 obesity due to excess calories with serious comorbidity and body mass index (BMI) of 30.0 to 30.9 in adult     -anticipatory guidance provided --- exercise, weight loss, safe driving practices, aspirin 81mg  daily. -obtain age appropriate screening labs and labs for chronic disease management. -OBESITY:  Recommend weight loss, exercise for 30-60 minutes five days per week; recommend 1600 kcal restriction per day with a minimum of 60 grams of protein per day.  Eat 3 meals per day. Do not skip meals. Consider having a protein shake as a meal replacement to aid with eliminating meal skipping. Look for products with <220 calories, <7 gm sugar, and 20-30 gm protein.  Eat breakfast within 2 hours of getting up.   Make  your plate non-starchy vegetables,  protein, and  carbohydrates at lunch and dinner.   Aim for at least 64 oz. of calorie-free beverages daily (water, Crystal Light, diet green tea, etc.). Eliminate any sugary beverages such as regular soda, sweet tea, or fruit juice.   Pay attention to hunger and fullness cues.  Stop eating once you feel satisfied; don't wait until you feel full, stuffed, or sick from eating.  Choose lean meats and low fat/fat free dairy products.  Choose foods high in fiber such as fruits, vegetables, and whole grains (brown rice, whole wheat pasta, whole wheat bread, etc.).  Limit foods with added sugar to <7 gm per serving.  Always eat in the kitchen/dining room.  Never eat in the bedroom or in front of the TV.  Coronary artery disease: Followed by cardiology on an annual basis.  Asymptomatic at this time.  Orders Placed This Encounter    Procedures  . CBC with Differential/Platelet  . Comprehensive metabolic panel    Order Specific Question:   Has the patient fasted?    Answer:   No  . Hemoglobin A1c  . Lipid panel    Order Specific Question:   Has the patient fasted?    Answer:   No  . PSA  . POCT urinalysis dipstick   Meds ordered this encounter  Medications  . DISCONTD: amLODipine (NORVASC) 5 MG tablet    Sig: Take 1 tablet (5 mg total) by mouth daily.    Dispense:  90 tablet    Refill:  3  . dicyclomine (BENTYL) 10 MG capsule    Sig: TAKE ONE CAPSULE BY MOUTH THREE TIMES A DAY BEFORE MEALS as needed    Dispense:  60 capsule    Refill:  2  . LORazepam (ATIVAN) 1 MG tablet    Sig: Take 0.5-1 tablets (0.5-1 mg total) by mouth daily as needed for anxiety.    Dispense:  30 tablet    Refill:  4  . metoprolol succinate (TOPROL XL) 50 MG 24 hr tablet    Sig: Take 1 tablet (50 mg total) by mouth daily. Take with or immediately following a meal.    Dispense:  90 tablet  Refill:  3  . NITROSTAT 0.4 MG SL tablet    Sig: PLACE ONE TABLET UNDER THE TONGUE EVERY FIVE MINUTES AS NEEDED FOR CHEST PAIN x 3 doses    Dispense:  25 tablet    Refill:  4  . rosuvastatin (CRESTOR) 40 MG tablet    Sig: Take 1 tablet (40 mg total) by mouth daily.    Dispense:  90 tablet    Refill:  3  . tadalafil (CIALIS) 20 MG tablet    Sig: Take 0.5 tablets (10 mg total) by mouth daily as needed for erectile dysfunction.    Dispense:  40 tablet    Refill:  0  . valACYclovir (VALTREX) 500 MG tablet    Sig: Take 1 tablet (500 mg total) by mouth daily. Increase to bid x 5 days PRN outbreak    Dispense:  100 tablet    Refill:  3    Return in about 1 year (around 04/20/2018) for complete physical examiniation.   Darcey Cardy Paulita Fujita, M.D. Primary Care at [facility] previously [facility] [address] [place], [place]  [address] [phone] phone [phone] fax

## 2017-04-19 NOTE — Patient Instructions (Addendum)
   IF you received an x-ray today, you will receive an invoice from [facility]. Please contact [facility] at [phone] with questions or concerns regarding your invoice.   IF you received labwork today, you will receive an invoice from [facility]. Please contact [facility] at [phone] with questions or concerns regarding your invoice.   Our billing staff will not be able to assist you with questions regarding bills from these companies.  You will be contacted with the lab results as soon as they are available. The fastest way to get your results is to activate your My Chart account. Instructions are located on the last page of this paperwork. If you have not heard from us regarding the results in 2 weeks, please contact this office.      Preventive Care 40-64 Years, Male Preventive care refers to lifestyle choices and visits with your health care provider that can promote health and wellness. What does preventive care include?  A yearly physical exam. This is also called an annual well check.  Dental exams once or twice a year.  Routine eye exams. Ask your health care provider how often you should have your eyes checked.  Personal lifestyle choices, including: ? Daily care of your teeth and gums. ? Regular physical activity. ? Eating a healthy diet. ? Avoiding tobacco and drug use. ? Limiting alcohol use. ? Practicing safe sex. ? Taking low-dose aspirin every day starting at age 50. What happens during an annual well check? The services and screenings done by your health care provider during your annual well check will depend on your age, overall health, lifestyle risk factors, and family history of disease. Counseling Your health care provider may ask you questions about your:  Alcohol use.  Tobacco use.  Drug use.  Emotional well-being.  Home and relationship well-being.  Sexual activity.  Eating habits.  Work and work  environment.  Screening You may have the following tests or measurements:  Height, weight, and BMI.  Blood pressure.  Lipid and cholesterol levels. These may be checked every 5 years, or more frequently if you are over 50 years old.  Skin check.  Lung cancer screening. You may have this screening every year starting at age 55 if you have a 30-pack-year history of smoking and currently smoke or have quit within the past 15 years.  Fecal occult blood test (FOBT) of the stool. You may have this test every year starting at age 50.  Flexible sigmoidoscopy or colonoscopy. You may have a sigmoidoscopy every 5 years or a colonoscopy every 10 years starting at age 50.  Prostate cancer screening. Recommendations will vary depending on your family history and other risks.  Hepatitis C blood test.  Hepatitis B blood test.  Sexually transmitted disease (STD) testing.  Diabetes screening. This is done by checking your blood sugar (glucose) after you have not eaten for a while (fasting). You may have this done every 1-3 years.  Discuss your test results, treatment options, and if necessary, the need for more tests with your health care provider. Vaccines Your health care provider may recommend certain vaccines, such as:  Influenza vaccine. This is recommended every year.  Tetanus, diphtheria, and acellular pertussis (Tdap, Td) vaccine. You may need a Td booster every 10 years.  Varicella vaccine. You may need this if you have not been vaccinated.  Zoster vaccine. You may need this after age 60.  Measles, mumps, and rubella (MMR) vaccine. You may need at least one dose   of MMR if you were born in 1957 or later. You may also need a second dose.  Pneumococcal 13-valent conjugate (PCV13) vaccine. You may need this if you have certain conditions and have not been vaccinated.  Pneumococcal polysaccharide (PPSV23) vaccine. You may need one or two doses if you smoke cigarettes or if you have  certain conditions.  Meningococcal vaccine. You may need this if you have certain conditions.  Hepatitis A vaccine. You may need this if you have certain conditions or if you travel or work in places where you may be exposed to hepatitis A.  Hepatitis B vaccine. You may need this if you have certain conditions or if you travel or work in places where you may be exposed to hepatitis B.  Haemophilus influenzae type b (Hib) vaccine. You may need this if you have certain risk factors.  Talk to your health care provider about which screenings and vaccines you need and how often you need them. This information is not intended to replace advice given to you by your health care provider. Make sure you discuss any questions you have with your health care provider. Document Released: 02/08/2015 Document Revised: 10/02/2015 Document Reviewed: 11/13/2014 Elsevier Interactive Patient Education  [employer].

## 2017-04-20 LAB — COMPREHENSIVE METABOLIC PANEL
A/G RATIO: 2.5 — AB (ref 1.2–2.2)
ALT: 20 IU/L (ref 0–44)
AST: 24 IU/L (ref 0–40)
Albumin: 4.7 g/dL (ref 3.6–4.8)
Alkaline Phosphatase: 64 IU/L (ref 39–117)
BUN / CREAT RATIO: 15 (ref 10–24)
BUN: 13 mg/dL (ref 8–27)
Bilirubin Total: 0.5 mg/dL (ref 0.0–1.2)
CALCIUM: 9.3 mg/dL (ref 8.6–10.2)
CO2: 24 mmol/L (ref 20–29)
Chloride: 101 mmol/L (ref 96–106)
Creatinine, Ser: 0.87 mg/dL (ref 0.76–1.27)
GFR, EST AFRICAN AMERICAN: 106 mL/min/{1.73_m2} (ref >59)
GFR, EST NON AFRICAN AMERICAN: 92 mL/min/{1.73_m2} (ref >59)
GLOBULIN, TOTAL: 1.9 g/dL (ref 1.5–4.5)
Glucose: 91 mg/dL (ref 65–99)
POTASSIUM: 4.6 mmol/L (ref 3.5–5.2)
Sodium: 141 mmol/L (ref 134–144)
TOTAL PROTEIN: 6.6 g/dL (ref 6.0–8.5)

## 2017-04-20 LAB — CBC WITH DIFFERENTIAL/PLATELET
BASOS: 1 %
Basophils Absolute: 0.1 10*3/uL (ref 0.0–0.2)
EOS (ABSOLUTE): 0.3 10*3/uL (ref 0.0–0.4)
Eos: 4 %
HEMOGLOBIN: 15.4 g/dL (ref 13.0–17.7)
Hematocrit: 46.4 % (ref 37.5–51.0)
IMMATURE GRANS (ABS): 0 10*3/uL (ref 0.0–0.1)
IMMATURE GRANULOCYTES: 0 %
LYMPHS: 31 %
Lymphocytes Absolute: 2.2 10*3/uL (ref 0.7–3.1)
MCH: 30.3 pg (ref 26.6–33.0)
MCHC: 33.2 g/dL (ref 31.5–35.7)
MCV: 91 fL (ref 79–97)
Monocytes Absolute: 0.4 10*3/uL (ref 0.1–0.9)
Monocytes: 6 %
NEUTROS ABS: 4 10*3/uL (ref 1.4–7.0)
NEUTROS PCT: 58 %
Platelets: 145 10*3/uL — ABNORMAL LOW (ref 150–379)
RBC: 5.09 x10E6/uL (ref 4.14–5.80)
RDW: 13.5 % (ref 12.3–15.4)
WBC: 7 10*3/uL (ref 3.4–10.8)

## 2017-04-20 LAB — LIPID PANEL
CHOL/HDL RATIO: 5.4 ratio — AB (ref 0.0–5.0)
CHOLESTEROL TOTAL: 124 mg/dL (ref 100–199)
HDL: 23 mg/dL — ABNORMAL LOW (ref >39)
LDL Calculated: 54 mg/dL (ref 0–99)
Triglycerides: 236 mg/dL — ABNORMAL HIGH (ref 0–149)
VLDL CHOLESTEROL CAL: 47 mg/dL — AB (ref 5–40)

## 2017-04-20 LAB — PSA: Prostate Specific Ag, Serum: 1.3 ng/mL (ref 0.0–4.0)

## 2017-04-20 LAB — HEMOGLOBIN A1C
Est. average glucose Bld gHb Est-mCnc: 108 mg/dL
Hgb A1c MFr Bld: 5.4 % (ref 4.8–5.6)

## 2017-04-22 ENCOUNTER — Telehealth: Payer: Self-pay | Admitting: Family Medicine

## 2017-04-22 ENCOUNTER — Encounter: Payer: Self-pay | Admitting: Family Medicine

## 2017-04-22 NOTE — Telephone Encounter (Signed)
Copied from CRM [id number]. Topic: Quick Communication - Lab Results >> Apr 22, 2017 12:40 PM Guinevere FerrariMorris, Lajada Janes E, NT wrote: Patient called and wanted to see if lab results were in and would like a call back.

## 2017-04-22 NOTE — Telephone Encounter (Signed)
Please review. Thank You.

## 2017-05-01 ENCOUNTER — Ambulatory Visit: Payer: 59 | Admitting: Physician Assistant

## 2017-05-10 NOTE — Telephone Encounter (Signed)
I have sent patient a MyChart message yet patient requested a CALL back on original message. So please call him with MyChart message:  Your labs have been released via MyChart as of 04/23/17.All lab results are released via MyChart four days after labs were drawn.As you can see, all of your labs are normal other than your triglycerides which are elevated and platelets that are slightly low.Please make sure to take Fish Oil 100mg  two tablets twice daily; I also recommend a low-fat diet and weight loss to lower your triglyceride level.Your platelet count is slightly low which is chronic; due to the mild abnormality, no further work up is warranted at this time.

## 2017-05-26 ENCOUNTER — Other Ambulatory Visit: Payer: Self-pay | Admitting: Cardiovascular Disease

## 2017-05-27 NOTE — Telephone Encounter (Signed)
REFILL 

## 2017-06-17 ENCOUNTER — Other Ambulatory Visit: Payer: Self-pay | Admitting: Cardiovascular Disease

## 2017-06-18 NOTE — Telephone Encounter (Signed)
Rx request sent to pharmacy.  

## 2017-06-22 ENCOUNTER — Encounter: Payer: Self-pay | Admitting: Family Medicine

## 2017-06-24 ENCOUNTER — Telehealth: Payer: Self-pay | Admitting: Family Medicine

## 2017-06-24 NOTE — Telephone Encounter (Signed)
I left a voicemail in regards to cancelling/rescheduling his appt with Dr. Katrinka Blazing on 04/04/2018 due to provider leaving.

## 2017-07-11 ENCOUNTER — Encounter: Payer: Self-pay | Admitting: Family Medicine

## 2017-08-06 NOTE — Telephone Encounter (Signed)
Pt would like to see if this can be signed today so that he may pick this up tomorrow.

## 2017-08-09 NOTE — Telephone Encounter (Signed)
Pt called to see if his letter has been signed, contact pt to inform

## 2017-08-12 ENCOUNTER — Telehealth: Payer: Self-pay | Admitting: Family Medicine

## 2017-08-12 NOTE — Telephone Encounter (Signed)
pcpCopied from CRM [id number]. Topic: General - Other >> Aug 11, 2017  5:42 PM Raquel SarnaHayes, Teresa G wrote: Pt is planning on coming by on Fri. To pick up his signed letter Dr. Katrinka BlazingSmith is supposed to sign to give to his employer.

## 2017-08-17 NOTE — Telephone Encounter (Signed)
Please advise 

## 2017-08-17 NOTE — Telephone Encounter (Signed)
Patient would like a call back to discuss his paperwork Dr Katrinka BlazingSmith was supposed to fill out. He's calling about this for about a month.

## 2017-08-18 NOTE — Telephone Encounter (Signed)
Please clarify regarding paperwork. I do not have any paperwork to complete for patient.

## 2017-08-19 ENCOUNTER — Telehealth: Payer: Self-pay | Admitting: Family Medicine

## 2017-08-19 NOTE — Telephone Encounter (Unsigned)
Copied from CRM [id number]. Topic: General - Other >> Aug 11, 2017  5:42 PM Raquel SarnaHayes, Teresa G wrote: Pt is planning on coming by on Fri. To pick up his signed letter Dr. Katrinka BlazingSmith is supposed to sign to give to his employer.

## 2017-08-19 NOTE — Telephone Encounter (Signed)
Left message in voice mail to call back concerning what is the form to pick up for his employer.

## 2017-08-20 NOTE — Telephone Encounter (Signed)
See message below °

## 2017-09-05 ENCOUNTER — Ambulatory Visit (HOSPITAL_COMMUNITY)
Admission: EM | Admit: 2017-09-05 | Discharge: 2017-09-05 | Disposition: A | Discharge status: Home / Self Care | Payer: 59 | Attending: Family Medicine | Admitting: Family Medicine

## 2017-09-05 ENCOUNTER — Other Ambulatory Visit: Payer: Self-pay

## 2017-09-05 ENCOUNTER — Encounter (HOSPITAL_COMMUNITY): Payer: Self-pay | Admitting: Family Medicine

## 2017-09-05 DIAGNOSIS — K921 Melena: Secondary | ICD-10-CM | POA: Diagnosis not present

## 2017-09-05 NOTE — ED Triage Notes (Signed)
Pt reports a hx of polyps and IBS.  He states he first started seeing blood in his stools around Wednesday.  Pt is scheduled for a colonoscopy on October 11.

## 2017-09-05 NOTE — Discharge Instructions (Addendum)
Call your gastroenterologist tomorrow to move up the colonoscopy.  Explained that you have had some blood per rectum and have stopped your aspirin.  In addition, take 30 mg of Prevacid daily and do not exercise intensely until you have had the colonoscopy follow-up.

## 2017-09-05 NOTE — ED Provider Notes (Signed)
[facility]    CSN: [id number] Arrival date & time: 09/05/17  1456     History   Chief Complaint Chief Complaint  Patient presents with  . Blood In Stools    HPI Harold Newman is a 62 y.o. male.   HPI Patient here for evaluation of blood in stool.  This began 2 days ago.  He has had some mild cramping is moved from right to left.  He has had no fever, diarrhea, or persistent localized abdominal pain.  Patient is already stopped his aspirin.  He has started his Prevacid.  He had a colonoscopy in 2013 with polyp biopsied and found to be a tubular adenoma.  He has scheduled a follow-up colonoscopy in October with his doctor in [place].  Patient's original colonoscopy was done because when he was on Plavix, he developed anemia and iron deficiency.  Patient had no lightheadedness Past Medical History:  Diagnosis Date  . Bipolar disorder (HCC)   . CAD (coronary artery disease)   . GERD (gastroesophageal reflux disease)   . Hyperlipidemia   . Hypertension   . Irritable bowel syndrome (IBS)     Patient Active Problem List   Diagnosis Date Noted  . Seasonal allergic rhinitis due to pollen 11/10/2016  . Class 1 obesity due to excess calories with serious comorbidity and body mass index (BMI) of 31.0 to 31.9 in adult 07/19/2016  . GERD (gastroesophageal reflux disease) 06/27/2016  . Eczema 10/01/2013  . CAD (coronary artery disease) 01/12/2013  . Hyperlipidemia with target LDL less than 70 01/12/2013  . Bipolar disorder (HCC) 01/12/2013  . Essential hypertension 01/12/2013  . IBS (irritable bowel syndrome) 08/27/2011    Past Surgical History:  Procedure Laterality Date  . APPENDECTOMY  10/2000  . CARDIAC CATHETERIZATION  03/17/2005   low-normal LV function, 2 vessel CAD (subtotal occlusion of LAD, stenosis of small 2nd diagonal and with mid LAD narrowing, total occlusion of distal Cfx); PTCA and stent of LAD and 2nd diagonal with 3.5x51mm Cypher DES (Dr.  Nicki Guadalajara)  . CARDIAC CATHETERIZATION  04/14/2005   2.5x103mm Cypher DES to L Cfx (Dr. Nicki Guadalajara)  . COLONOSCOPY  08/2011  . NM MYOCAR PERF WALL MOTION  2011   bruce myoview - normal pattern of perfusion, low risk  . TRANSTHORACIC ECHOCARDIOGRAM  2007   mild MR & TR, AV mildly sclerotic       Home Medications    Prior to Admission medications   Medication Sig Start Date End Date Taking? Authorizing Provider  albuterol (PROVENTIL HFA;VENTOLIN HFA) 108 (90 Base) MCG/ACT inhaler Inhale 2 puffs into the lungs every 6 (six) hours as needed for wheezing or shortness of breath.   Yes [provider]  amLODipine (NORVASC) 5 MG tablet Take 1 tablet (5 mg total) by mouth daily. NEED OV. 05/27/17  Yes Lennette Bihari, MD  aspirin EC 81 MG tablet Take 1 tablet (81 mg total) by mouth daily. 11/22/15  Yes Lennette Bihari, MD  co-enzyme Q-10 30 MG capsule Take 200 mg by mouth daily.   Yes [provider]  Cyanocobalamin (VITAMIN B-12 PO) Take 5,000 mg by mouth daily. 5000 i.u.    Yes [provider]  D-Ribose POWD 5 g by Does not apply route daily.   Yes [provider]  dicyclomine (BENTYL) 10 MG capsule TAKE ONE CAPSULE BY MOUTH THREE TIMES A DAY BEFORE MEALS as needed 04/19/17  Yes Ethelda Chick, MD  lansoprazole (PREVACID) 15  MG capsule Take 30 mg by mouth daily.    Yes [provider]  LevOCARNitine L-Tartrate (L-CARNITINE) 500 MG CAPS Take 500 mg by mouth daily.   Yes [provider]  LORazepam (ATIVAN) 1 MG tablet Take 0.5-1 tablets (0.5-1 mg total) by mouth daily as needed for anxiety. 04/19/17  Yes Ethelda ChickSmith, Kristi M, MD  losartan (COZAAR) 25 MG tablet Take 1 tablet (25 mg total) by mouth daily. Please schedule appointment for refills. 06/18/17  Yes Lennette BihariKelly, Thomas A, MD  metoprolol succinate (TOPROL XL) 50 MG 24 hr tablet Take 1 tablet (50 mg total) by mouth daily. Take with or immediately following a meal. 04/19/17  Yes Ethelda ChickSmith, Kristi M, MD    Multiple Vitamins-Minerals (MULTIVITAMIN WITH MINERALS) tablet Take 1 tablet by mouth daily.   Yes [provider]  NAFTIN 2 % CREA Apply 1 application topically as needed. 06/02/13  Yes [provider]  NATTOKINASE PO Take 2,000 mg by mouth.    Yes [provider]  NITROSTAT 0.4 MG SL tablet PLACE ONE TABLET UNDER THE TONGUE EVERY FIVE MINUTES AS NEEDED FOR CHEST PAIN x 3 doses 04/19/17  Yes Ethelda ChickSmith, Kristi M, MD  Omega-3 Fatty Acids (FISH OIL PO) Take 1 g by mouth daily.    Yes [provider]  rosuvastatin (CRESTOR) 40 MG tablet Take 1 tablet (40 mg total) by mouth daily. 04/19/17  Yes Ethelda ChickSmith, Kristi M, MD  tadalafil (CIALIS) 20 MG tablet Take 0.5 tablets (10 mg total) by mouth daily as needed for erectile dysfunction. 04/19/17  Yes Ethelda ChickSmith, Kristi M, MD  valACYclovir (VALTREX) 500 MG tablet Take 1 tablet (500 mg total) by mouth daily. Increase to bid x 5 days PRN outbreak 04/19/17  Yes Ethelda ChickSmith, Kristi M, MD    Family History Family History  Problem Relation Age of Onset  . Aplastic anemia Mother   . Hypertension Mother   . Heart failure Father   . Heart disease Father        CHF  . Alcohol abuse Father   . Heart attack Maternal Grandfather   . Brain cancer Brother   . Alcohol abuse Brother   . Gout Brother   . Hypertension Brother   . Parkinson's disease Brother   . Cancer Brother 1964       brain tumor    Social History Social History   Tobacco Use  . Smoking status: Never Smoker  . Smokeless tobacco: Never Used  Substance Use Topics  . Alcohol use: Yes    Alcohol/week: 1.0 standard drinks    Types: 1 Glasses of wine per week    Comment: rarely   . Drug use: No     Allergies   Anticoagulant compound; Clopidogrel; and Meloxicam   Review of Systems Review of Systems  Constitutional: Negative.   HENT: Negative.   Respiratory: Negative.   Cardiovascular: Negative.   Gastrointestinal: Positive for abdominal pain and blood in stool. Negative  for anal bleeding, diarrhea and rectal pain.  Genitourinary: Negative.   Neurological: Negative.      Physical Exam Triage Vital Signs ED Triage Vitals [09/05/17 1504]  Enc Vitals Group     BP 137/87     Pulse Rate 75     Resp 16     Temp 98.3 F (36.8 C)     Temp Source Oral     SpO2 97 %     Weight      Height      Head Circumference  Peak Flow      Pain Score      Pain Loc      Pain Edu?      Excl. in GC?    No data found.  Updated Vital Signs BP 137/87 (BP Location: Right Arm)   Pulse 75   Temp 98.3 F (36.8 C) (Oral)   Resp 16   SpO2 97%   Physical Exam  Constitutional: He is oriented to person, place, and time. He appears well-developed and well-nourished.  HENT:  Head: Normocephalic and atraumatic.  Right Ear: External ear normal.  Left Ear: External ear normal.  Eyes: Pupils are equal, round, and reactive to light. Conjunctivae and EOM are normal.  Neck: Normal range of motion. Neck supple.  Pulmonary/Chest: Effort normal.  Abdominal: Soft. Bowel sounds are normal. He exhibits no mass. There is no tenderness. There is no rebound and no guarding.  Diagonal surgical right lower quadrant incision corresponding to appendectomy  Musculoskeletal: Normal range of motion.  Neurological: He is alert and oriented to person, place, and time.  Skin: Skin is warm and dry.  Nursing note and vitals reviewed.    UC Treatments / Results  Labs (all labs ordered are listed, but only abnormal results are displayed) Labs Reviewed - No data to display  EKG None  Radiology No results found.  Procedures Procedures (including critical care time)  Medications Ordered in UC Medications - No data to display  Initial Impression / Assessment and Plan / UC Course  I have reviewed the triage vital signs and the nursing notes.  Pertinent labs & imaging results that were available during my care of the patient were reviewed by me and considered in my medical  decision making (see chart for details).     Final Clinical Impressions(s) / UC Diagnoses   Final diagnoses:  Hematochezia     Discharge Instructions     Call your gastroenterologist tomorrow to move up the colonoscopy.  Explained that you have had some blood per rectum and have stopped your aspirin.  In addition, take 30 mg of Prevacid daily and do not exercise intensely until you have had the colonoscopy follow-up.    ED Prescriptions    None     Controlled Substance Prescriptions [place] Controlled Substance Registry consulted? Not Applicable   Elvina Sidle, MD 09/05/17 1526

## 2017-11-06 ENCOUNTER — Encounter (HOSPITAL_BASED_OUTPATIENT_CLINIC_OR_DEPARTMENT_OTHER): Payer: Self-pay

## 2017-11-06 ENCOUNTER — Emergency Department (HOSPITAL_BASED_OUTPATIENT_CLINIC_OR_DEPARTMENT_OTHER)
Admission: EM | Admit: 2017-11-06 | Discharge: 2017-11-06 | Disposition: A | Discharge status: Home / Self Care | Payer: 59 | Attending: Emergency Medicine | Admitting: Emergency Medicine

## 2017-11-06 ENCOUNTER — Other Ambulatory Visit: Payer: Self-pay

## 2017-11-06 DIAGNOSIS — N99820 Postprocedural hemorrhage and hematoma of a genitourinary system organ or structure following a genitourinary system procedure: Secondary | ICD-10-CM | POA: Diagnosis not present

## 2017-11-06 DIAGNOSIS — Z7982 Long term (current) use of aspirin: Secondary | ICD-10-CM | POA: Insufficient documentation

## 2017-11-06 DIAGNOSIS — Z79899 Other long term (current) drug therapy: Secondary | ICD-10-CM | POA: Diagnosis not present

## 2017-11-06 DIAGNOSIS — K91840 Postprocedural hemorrhage and hematoma of a digestive system organ or structure following a digestive system procedure: Secondary | ICD-10-CM

## 2017-11-06 DIAGNOSIS — K625 Hemorrhage of anus and rectum: Secondary | ICD-10-CM | POA: Insufficient documentation

## 2017-11-06 DIAGNOSIS — I10 Essential (primary) hypertension: Secondary | ICD-10-CM | POA: Insufficient documentation

## 2017-11-06 DIAGNOSIS — F319 Bipolar disorder, unspecified: Secondary | ICD-10-CM | POA: Diagnosis not present

## 2017-11-06 DIAGNOSIS — I2510 Atherosclerotic heart disease of native coronary artery without angina pectoris: Secondary | ICD-10-CM | POA: Insufficient documentation

## 2017-11-06 DIAGNOSIS — R195 Other fecal abnormalities: Secondary | ICD-10-CM | POA: Diagnosis present

## 2017-11-06 LAB — BASIC METABOLIC PANEL
ANION GAP: 9 (ref 5–15)
BUN: 17 mg/dL (ref 8–23)
CALCIUM: 9.3 mg/dL (ref 8.9–10.3)
CO2: 23 mmol/L (ref 22–32)
Chloride: 103 mmol/L (ref 98–111)
Creatinine, Ser: 0.78 mg/dL (ref 0.61–1.24)
GFR calc Af Amer: >60 mL/min (ref >60)
GFR calc non Af Amer: >60 mL/min (ref >60)
Glucose, Bld: 106 mg/dL — ABNORMAL HIGH (ref 70–99)
Potassium: 4.1 mmol/L (ref 3.5–5.1)
Sodium: 135 mmol/L (ref 135–145)

## 2017-11-06 LAB — CBC WITH DIFFERENTIAL/PLATELET
ABS IMMATURE GRANULOCYTES: 0.02 10*3/uL (ref 0.00–0.07)
Basophils Absolute: 0.1 10*3/uL (ref 0.0–0.1)
Basophils Relative: 1 %
EOS ABS: 0.1 10*3/uL (ref 0.0–0.5)
Eosinophils Relative: 1 %
HCT: 44.7 % (ref 39.0–52.0)
Hemoglobin: 14.8 g/dL (ref 13.0–17.0)
IMMATURE GRANULOCYTES: 0 %
LYMPHS ABS: 1.3 10*3/uL (ref 0.7–4.0)
Lymphocytes Relative: 20 %
MCH: 29.1 pg (ref 26.0–34.0)
MCHC: 33.1 g/dL (ref 30.0–36.0)
MCV: 88 fL (ref 80.0–100.0)
MONO ABS: 0.5 10*3/uL (ref 0.1–1.0)
MONOS PCT: 8 %
NEUTROS ABS: 4.5 10*3/uL (ref 1.7–7.7)
NEUTROS PCT: 70 %
Platelets: 153 10*3/uL (ref 150–400)
RBC: 5.08 MIL/uL (ref 4.22–5.81)
RDW: 13.3 % (ref 11.5–15.5)
WBC: 6.4 10*3/uL (ref 4.0–10.5)
nRBC: 0 % (ref 0.0–0.2)

## 2017-11-06 LAB — HEMOGLOBIN AND HEMATOCRIT, BLOOD
HEMATOCRIT: 43.7 % (ref 39.0–52.0)
HEMOGLOBIN: 14.5 g/dL (ref 13.0–17.0)

## 2017-11-06 LAB — PROTIME-INR
INR: 1.02
Prothrombin Time: 13.3 seconds (ref 11.4–15.2)

## 2017-11-06 MED ORDER — SODIUM CHLORIDE 0.9 % IV BOLUS (SEPSIS)
500.0000 mL | Freq: Once | INTRAVENOUS | Status: AC
  Administered 2017-11-06: 500 mL via INTRAVENOUS

## 2017-11-06 MED ORDER — SODIUM CHLORIDE 0.9 % IV SOLN
1000.0000 mL | INTRAVENOUS | Status: DC
  Administered 2017-11-06: 1000 mL via INTRAVENOUS

## 2017-11-06 NOTE — ED Notes (Signed)
ED Provider at bedside. 

## 2017-11-06 NOTE — ED Notes (Signed)
Pt denies dizziness and lightheadedness

## 2017-11-06 NOTE — ED Triage Notes (Signed)
Pt reports having a colonoscopy yesterday and now c/o x3 "bright red bloody stools". Pt c/o abd cramping 3/10. Pt denies n/v. Pt A+OX4, ambulatory independently.

## 2017-11-06 NOTE — ED Provider Notes (Signed)
[facility] EMERGENCY DEPARTMENT Provider Note   CSN: [id number] Arrival date & time: 11/06/17  0844     History   Chief Complaint Chief Complaint  Patient presents with  . Rectal Bleeding    HPI Harold Newman is a 62 y.o. male.  HPI Patient had colonoscopy yesterday.  He has history of polyps but no history of cancer.  He reports several polyps were snared and cauterized.  He had an episode of bloody stool last night after the procedure.  He reports that he then had 2 more episodes this morning and the volume was larger.  He reports he had some mild abdominal cramping but no pain.  No vomiting.  He denies lightheadedness or dizziness.  He does take a daily aspirin.  No chest pain or shortness of breath.  No chest pain or shortness of breath. Past Medical History:  Diagnosis Date  . Bipolar disorder (HCC)   . CAD (coronary artery disease)   . GERD (gastroesophageal reflux disease)   . Hyperlipidemia   . Hypertension   . Irritable bowel syndrome (IBS)     Patient Active Problem List   Diagnosis Date Noted  . Seasonal allergic rhinitis due to pollen 11/10/2016  . Class 1 obesity due to excess calories with serious comorbidity and body mass index (BMI) of 31.0 to 31.9 in adult 07/19/2016  . GERD (gastroesophageal reflux disease) 06/27/2016  . Eczema 10/01/2013  . CAD (coronary artery disease) 01/12/2013  . Hyperlipidemia with target LDL less than 70 01/12/2013  . Bipolar disorder (HCC) 01/12/2013  . Essential hypertension 01/12/2013  . IBS (irritable bowel syndrome) 08/27/2011    Past Surgical History:  Procedure Laterality Date  . APPENDECTOMY  10/2000  . CARDIAC CATHETERIZATION  03/17/2005   low-normal LV function, 2 vessel CAD (subtotal occlusion of LAD, stenosis of small 2nd diagonal and with mid LAD narrowing, total occlusion of distal Cfx); PTCA and stent of LAD and 2nd diagonal with 3.5x33mm Cypher DES (Dr. Nicki Guadalajara)  . CARDIAC CATHETERIZATION   04/14/2005   2.5x40mm Cypher DES to L Cfx (Dr. Nicki Guadalajara)  . COLONOSCOPY  08/2011  . NM MYOCAR PERF WALL MOTION  2011   bruce myoview - normal pattern of perfusion, low risk  . TRANSTHORACIC ECHOCARDIOGRAM  2007   mild MR & TR, AV mildly sclerotic        Home Medications    Prior to Admission medications   Medication Sig Start Date End Date Taking? Authorizing Provider  albuterol (PROVENTIL HFA;VENTOLIN HFA) 108 (90 Base) MCG/ACT inhaler Inhale 2 puffs into the lungs every 6 (six) hours as needed for wheezing or shortness of breath.    [provider]  amLODipine (NORVASC) 5 MG tablet Take 1 tablet (5 mg total) by mouth daily. NEED OV. Patient taking differently: Take 7.5 mg by mouth daily. NEED OV. 05/27/17   Lennette Bihari, MD  aspirin EC 81 MG tablet Take 1 tablet (81 mg total) by mouth daily. 11/22/15   Lennette Bihari, MD  co-enzyme Q-10 30 MG capsule Take 200 mg by mouth daily.    [provider]  Cyanocobalamin (VITAMIN B-12 PO) Take 5,000 mg by mouth daily. 5000 i.u.     [provider]  D-Ribose POWD 5 g by Does not apply route daily.    [provider]  dicyclomine (BENTYL) 10 MG capsule TAKE ONE CAPSULE BY MOUTH THREE TIMES A DAY BEFORE MEALS as needed 04/19/17   Ethelda Chick,  MD  lansoprazole (PREVACID) 15 MG capsule Take 30 mg by mouth daily.     [provider]  LevOCARNitine L-Tartrate (L-CARNITINE) 500 MG CAPS Take 500 mg by mouth daily.    [provider]  LORazepam (ATIVAN) 1 MG tablet Take 0.5-1 tablets (0.5-1 mg total) by mouth daily as needed for anxiety. 04/19/17   Ethelda Chick, MD  losartan (COZAAR) 25 MG tablet Take 1 tablet (25 mg total) by mouth daily. Please schedule appointment for refills. 06/18/17   Lennette Bihari, MD  metoprolol succinate (TOPROL XL) 50 MG 24 hr tablet Take 1 tablet (50 mg total) by mouth daily. Take with or immediately following a meal. 04/19/17   Ethelda Chick, MD  Multiple  Vitamins-Minerals (MULTIVITAMIN WITH MINERALS) tablet Take 1 tablet by mouth daily.    [provider]  NAFTIN 2 % CREA Apply 1 application topically as needed. 06/02/13   [provider]  NATTOKINASE PO Take 2,000 mg by mouth.     [provider]  NITROSTAT 0.4 MG SL tablet PLACE ONE TABLET UNDER THE TONGUE EVERY FIVE MINUTES AS NEEDED FOR CHEST PAIN x 3 doses 04/19/17   Ethelda Chick, MD  Omega-3 Fatty Acids (FISH OIL PO) Take 1 g by mouth daily.     [provider]  rosuvastatin (CRESTOR) 40 MG tablet Take 1 tablet (40 mg total) by mouth daily. 04/19/17   Ethelda Chick, MD  tadalafil (CIALIS) 20 MG tablet Take 0.5 tablets (10 mg total) by mouth daily as needed for erectile dysfunction. 04/19/17   Ethelda Chick, MD  valACYclovir (VALTREX) 500 MG tablet Take 1 tablet (500 mg total) by mouth daily. Increase to bid x 5 days PRN outbreak 04/19/17   Ethelda Chick, MD    Family History Family History  Problem Relation Age of Onset  . Aplastic anemia Mother   . Hypertension Mother   . Heart failure Father   . Heart disease Father        CHF  . Alcohol abuse Father   . Heart attack Maternal Grandfather   . Brain cancer Brother   . Alcohol abuse Brother   . Gout Brother   . Hypertension Brother   . Parkinson's disease Brother   . Cancer Brother 61       brain tumor    Social History Social History   Tobacco Use  . Smoking status: Never Smoker  . Smokeless tobacco: Never Used  Substance Use Topics  . Alcohol use: Yes    Alcohol/week: 1.0 standard drinks    Types: 1 Glasses of wine per week    Comment: rarely   . Drug use: No     Allergies   Anticoagulant compound; Clopidogrel; and Meloxicam   Review of Systems Review of Systems 10 Systems reviewed and are negative for acute change except as noted in the HPI.   Physical Exam Updated Vital Signs BP 116/80 (BP Location: Right Arm)   Pulse 74   Temp 98.3 F (36.8 C) (Oral)   Resp 16    Ht 5\' 8"  (1.727 m)   SpO2 98%   BMI 31.57 kg/m   Physical Exam  Constitutional: He is oriented to person, place, and time. He appears well-developed and well-nourished. No distress.  HENT:  Head: Normocephalic and atraumatic.  Eyes: EOM are normal.  Cardiovascular: Normal rate, regular rhythm, normal heart sounds and intact distal pulses.  Pulmonary/Chest: Effort normal and breath sounds normal.  Abdominal:  Soft. Bowel sounds are normal. He exhibits no distension. There is no tenderness. There is no guarding.  Genitourinary:  Genitourinary Comments: Dried blood in the perirectal area with no ongoing bleeding.  Digital exam has cranberry colored blood in the vault.  No large volume of blood present.  Musculoskeletal: Normal range of motion.  Neurological: He is alert and oriented to person, place, and time. He exhibits normal muscle tone. Coordination normal.  Skin: Skin is warm and dry.     ED Treatments / Results  Labs (all labs ordered are listed, but only abnormal results are displayed) Labs Reviewed  BASIC METABOLIC PANEL - Abnormal; Notable for the following components:      Result Value   Glucose, Bld 106 (*)    All other components within normal limits  CBC WITH DIFFERENTIAL/PLATELET  PROTIME-INR  HEMOGLOBIN AND HEMATOCRIT, BLOOD    EKG None  Radiology No results found.  Procedures Procedures (including critical care time)  Medications Ordered in ED Medications  sodium chloride 0.9 % bolus 500 mL (0 mLs Intravenous Stopped 11/06/17 1111)     Initial Impression / Assessment and Plan / ED Course  I have reviewed the triage vital signs and the nursing notes.  Pertinent labs & imaging results that were available during my care of the patient were reviewed by me and considered in my medical decision making (see chart for details).  Clinical Course as of Nov 07 1738  Sat Nov 06, 2017  1034 Reviewed results with patient.  Recheck.  He reports he has had  another bloody bowel movement.  This was in the toilet.  He advises it looks like a lot.  This got flushed and I was unable to visualize it.   [MP]    Clinical Course User Index [MP] Arby Barrette, MD   Patient status post colonoscopy.  Abdomen is soft and nontender.  He has had rectal bleeding greater than 3 episodes since the procedure.  He reports the episode this morning was more than yesterday.  CBC is stable.  Patient is not positive for orthostatic position change.  I did observe in the hospital for several hours.  Patient had another passage of dark, semi-coagulated blood approximately 3 tablespoons.  At this time, with stable H&H and stable blood pressure I feel patient is appropriate for continued home observation.  Return precautions are reviewed.    Final Clinical Impressions(s) / ED Diagnoses   Final diagnoses:  Rectal bleeding  Colonoscopy causing post-procedural bleeding    ED Discharge Orders    None       Arby Barrette, MD 11/06/17 1742

## 2017-12-03 ENCOUNTER — Ambulatory Visit: Payer: 59 | Admitting: Physician Assistant

## 2017-12-22 ENCOUNTER — Encounter: Payer: Self-pay | Admitting: Physician Assistant

## 2017-12-22 ENCOUNTER — Ambulatory Visit (INDEPENDENT_AMBULATORY_CARE_PROVIDER_SITE_OTHER): Payer: 59 | Admitting: Physician Assistant

## 2017-12-22 ENCOUNTER — Ambulatory Visit: Payer: 59 | Admitting: Physician Assistant

## 2017-12-22 VITALS — BP 110/70 | HR 69 | Ht 68.0 in | Wt 209.0 lb

## 2017-12-22 DIAGNOSIS — E785 Hyperlipidemia, unspecified: Secondary | ICD-10-CM | POA: Diagnosis not present

## 2017-12-22 DIAGNOSIS — I2510 Atherosclerotic heart disease of native coronary artery without angina pectoris: Secondary | ICD-10-CM | POA: Diagnosis not present

## 2017-12-22 DIAGNOSIS — I10 Essential (primary) hypertension: Secondary | ICD-10-CM

## 2017-12-22 MED ORDER — LOSARTAN POTASSIUM 25 MG PO TABS: 25.0000 mg | ORAL_TABLET | Freq: Every day | ORAL | 3 refills | Status: DC

## 2017-12-22 MED ORDER — METOPROLOL SUCCINATE ER 50 MG PO TB24: 50.0000 mg | ORAL_TABLET | Freq: Every day | ORAL | 3 refills | Status: DC

## 2017-12-22 MED ORDER — ROSUVASTATIN CALCIUM 40 MG PO TABS: 40.0000 mg | ORAL_TABLET | Freq: Every day | ORAL | 3 refills | Status: DC

## 2017-12-22 MED ORDER — AMLODIPINE BESYLATE 5 MG PO TABS: 7.5000 mg | ORAL_TABLET | Freq: Every day | ORAL | 3 refills | Status: DC

## 2017-12-22 NOTE — Progress Notes (Signed)
Cardiology Office Note    Date:  12/24/2017   ID:  DEMITRIOUS MCCANNON, DOB 01/07/1956, MRN [id number]  PCP:  Melvenia Beam, MD  Cardiologist: Dr. Tresa Endo  Chief Complaint  Patient presents with  . Follow-up    seen for Dr. Tresa Endo.     History of Present Illness:  Harold Newman is a 62 y.o. male with PMH of hyperlipidemia, CAD, hypertension and history of bipolar disorder.  He had a cardiac catheterization 2007 which revealed subtotal LAD stenosis with total occlusion of the left circumflex.  This procedure was performed after Myoview that showed multiple area of ischemia.  He underwent intervention of LAD and had a staged PCI of chronically occluded left circumflex as well.  Myoview in 2011 was normal without ischemia.  In 2015, he complained of increased exertional dyspnea and mild episodes of chest pain.  A Myoview was ordered but never done.  Patient was last seen by Dr. Tresa Endo on 06/15/2016, Dr. Tresa Endo recommended a exercise Myoview in 6 months, this was never completed either.    Patient has been yet out of hospital for several times since August of this year.  He initially presented with hematochezia on 09/05/2017.  He had a polypectomy with 2 polyps removed, a few days later, he went to the hospital with bright red blood per rectum.  Hemoglobin was stable therefore he was discharged home without further intervention.  Unfortunately patient experienced rectal pain and increased episode of bright red blood per rectum and was admitted on 11/08/2017 to [facility].  Hemoglobin has trended down to 9.5 by that point.  He had both colonoscopy and a sigmoidoscopy on 11/09/2017 which showed bleeding at the site of polypectomy, this was treated with epinephrine and a suture clip.  Hemoglobin post intervention was stable.  Patient has been recovering well since the recent procedure.  He says he did have some chest discomfort when he had a severe anemia, this has not recurred  in the past 4 weeks.  He has been able to walk around without any issue.  If he does have any recurrent exertional chest pain or shortness of breath, I would recommend him to have a treadmill nuclear stress test.  Otherwise, we will continue on the current medication.  He is due to have a repeat fasting lipid panel next March.   Past Medical History:  Diagnosis Date  . Bipolar disorder (HCC)   . CAD (coronary artery disease)   . GERD (gastroesophageal reflux disease)   . Hyperlipidemia   . Hypertension   . Irritable bowel syndrome (IBS)     Past Surgical History:  Procedure Laterality Date  . APPENDECTOMY  10/2000  . CARDIAC CATHETERIZATION  03/17/2005   low-normal LV function, 2 vessel CAD (subtotal occlusion of LAD, stenosis of small 2nd diagonal and with mid LAD narrowing, total occlusion of distal Cfx); PTCA and stent of LAD and 2nd diagonal with 3.5x59mm Cypher DES (Dr. Nicki Guadalajara)  . CARDIAC CATHETERIZATION  04/14/2005   2.5x69mm Cypher DES to L Cfx (Dr. Nicki Guadalajara)  . COLONOSCOPY  08/2011  . NM MYOCAR PERF WALL MOTION  2011   bruce myoview - normal pattern of perfusion, low risk  . TRANSTHORACIC ECHOCARDIOGRAM  2007   mild MR & TR, AV mildly sclerotic    Current Medications: Outpatient Medications Prior to Visit  Medication Sig Dispense Refill  . albuterol (PROVENTIL HFA;VENTOLIN HFA) 108 (90 Base) MCG/ACT inhaler Inhale 2 puffs into the  lungs every 6 (six) hours as needed for wheezing or shortness of breath.    Marland Kitchen. aspirin EC 81 MG tablet Take 1 tablet (81 mg total) by mouth daily. 90 tablet 3  . co-enzyme Q-10 30 MG capsule Take 200 mg by mouth daily.    . Cyanocobalamin (VITAMIN B-12 PO) Take 5,000 mg by mouth daily. 5000 i.u.     Marland Kitchen. D-Ribose POWD 5 g by Does not apply route daily.    Marland Kitchen. dicyclomine (BENTYL) 10 MG capsule TAKE ONE CAPSULE BY MOUTH THREE TIMES A DAY BEFORE MEALS as needed 60 capsule 2  . lansoprazole (PREVACID) 15 MG capsule Take 30 mg by mouth daily.       . LevOCARNitine L-Tartrate (L-CARNITINE) 500 MG CAPS Take 500 mg by mouth daily.    Marland Kitchen. LORazepam (ATIVAN) 1 MG tablet Take 0.5-1 tablets (0.5-1 mg total) by mouth daily as needed for anxiety. 30 tablet 4  . Multiple Vitamins-Minerals (MULTIVITAMIN WITH MINERALS) tablet Take 1 tablet by mouth daily.    Marland Kitchen. NAFTIN 2 % CREA Apply 1 application topically as needed.    Marland Kitchen. NATTOKINASE PO Take 2,000 mg by mouth.     Marland Kitchen. NITROSTAT 0.4 MG SL tablet PLACE ONE TABLET UNDER THE TONGUE EVERY FIVE MINUTES AS NEEDED FOR CHEST PAIN x 3 doses 25 tablet 4  . Omega-3 Fatty Acids (FISH OIL PO) Take 1 g by mouth daily.     . tadalafil (CIALIS) 20 MG tablet Take 0.5 tablets (10 mg total) by mouth daily as needed for erectile dysfunction. 40 tablet 0  . valACYclovir (VALTREX) 500 MG tablet Take 1 tablet (500 mg total) by mouth daily. Increase to bid x 5 days PRN outbreak 100 tablet 3  . amLODipine (NORVASC) 5 MG tablet Take 7.5 mg by mouth daily.    Marland Kitchen. losartan (COZAAR) 25 MG tablet Take 1 tablet (25 mg total) by mouth daily. Please schedule appointment for refills. 90 tablet 0  . metoprolol succinate (TOPROL XL) 50 MG 24 hr tablet Take 1 tablet (50 mg total) by mouth daily. Take with or immediately following a meal. 90 tablet 3  . rosuvastatin (CRESTOR) 40 MG tablet Take 1 tablet (40 mg total) by mouth daily. 90 tablet 3  . amLODipine (NORVASC) 5 MG tablet Take 1 tablet (5 mg total) by mouth daily. NEED OV. (Patient not taking: Reported on 12/22/2017) 90 tablet 0  . amLODipine (NORVASC) 5 MG tablet Take 1 tablet by mouth daily.     No facility-administered medications prior to visit.      Allergies:   Anticoagulant compound; Clopidogrel; and Meloxicam   Social History   Socioeconomic History  . Marital status: Married    Spouse name: Not on file  . Number of children: 3  . Years of education: 2316  . Highest education level: Not on file  Occupational History  . Occupation: Education administratorAnalyst     Employer: [employer]   Social Needs  . Financial resource strain: Not on file  . Food insecurity:    Worry: Not on file    Inability: Not on file  . Transportation needs:    Medical: Not on file    Non-medical: Not on file  Tobacco Use  . Smoking status: Never Smoker  . Smokeless tobacco: Never Used  Substance and Sexual Activity  . Alcohol use: Yes    Alcohol/week: 1.0 standard drinks    Types: 1 Glasses of wine per week    Comment: rarely   .  Drug use: No  . Sexual activity: Yes  Lifestyle  . Physical activity:    Days per week: Not on file    Minutes per session: Not on file  . Stress: Not on file  Relationships  . Social connections:    Talks on phone: Not on file    Gets together: Not on file    Attends religious service: Not on file    Active member of club or organization: Not on file    Attends meetings of clubs or organizations: Not on file    Relationship status: Not on file  Other Topics Concern  . 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      Exercise:  Walking 3 times per week 1-2 miles      Seatbelt: 100%; no texting while driving.     Family History:  The patient's family history includes Alcohol abuse in his brother and father; Aplastic anemia in his mother; Brain cancer in his brother; Cancer (age of onset: 54) in his brother; Gout in his brother; Heart attack in his maternal grandfather; Heart disease in his father; Heart failure in his father; Hypertension in his brother and mother; Parkinson's disease in his brother.   ROS:   Please see the history of present illness.    ROS All other systems reviewed and are negative.   PHYSICAL EXAM:   VS:  BP 110/70   Pulse 69   Ht 5\' 8"  (1.727 m)   Wt 209 lb (94.8  kg)   SpO2 99%   BMI 31.78 kg/m    GEN: Well nourished, well developed, in no acute distress  HEENT: normal  Neck: no JVD, carotid bruits, or masses Cardiac: RRR; no murmurs, rubs, or gallops,no edema  Respiratory:  clear to auscultation bilaterally, normal work of breathing GI: soft, nontender, nondistended, + BS MS: no deformity or atrophy  Skin: warm and dry, no rash Neuro:  Alert and Oriented x 3, Strength and sensation are intact Psych: euthymic mood, full affect  Wt Readings from Last 3 Encounters:  12/22/17 209 lb (94.8 kg)  04/19/17 207 lb 9.6 oz (94.2 kg)  11/07/16 206 lb (93.4 kg)      Studies/Labs Reviewed:   EKG:  EKG is ordered today.  The ekg ordered today demonstrates normal sinus rhythm without significant ST-T wave changes  Recent Labs: 04/19/2017: ALT 20 11/06/2017: BUN 17; Creatinine, Ser 0.78; Hemoglobin 14.5; Platelets 153; Potassium 4.1; Sodium 135   Lipid Panel    Component Value Date/Time   CHOL 124 04/19/2017 1509   TRIG 236 (H) 04/19/2017 1509   HDL 23 (L) 04/19/2017 1509   CHOLHDL 5.4 (H) 04/19/2017 1509   CHOLHDL 5.3 (H) 09/07/2015 1035   VLDL 27 09/07/2015 1035   LDLCALC 54 04/19/2017 1509    Additional studies/ records that were reviewed today include:   Cath 04/14/2005     ASSESSMENT:    1. Coronary artery disease involving native coronary artery of native heart without angina pectoris   2. Hyperlipidemia, unspecified hyperlipidemia type   3. Essential hypertension      PLAN:  In  order of problems listed above:  1. CAD: Patient was supposed to have a stress test last year, this was never done.  In the past year, he has been doing quite well.  His last episode of chest pain was over 4 weeks ago when he was still anemic.  Site of GI bleeding has been repaired on 11/09/2017 during colonoscopy, he has not had any more episode of chest pain since.  He is aware to contact cardiology if he does have recurrent chest pain, if so he will  need a treadmill nuclear stress test.  2. Hypertension: Blood pressure stable on current therapy  3. Hyperlipidemia: Continue Crestor 40 mg daily.  He is due for repeat lipid panel in March 2020.  Last lipid panel in March 2019 showed uncontrolled triglyceride.  He says he apparently was not aware that lab was a fasting lab therefore he has eaten something before he went for the lab work.    Medication Adjustments/Labs and Tests Ordered: Current medicines are reviewed at length with the patient today.  Concerns regarding medicines are outlined above.  Medication changes, Labs and Tests ordered today are listed in the Patient Instructions below. Patient Instructions  Medication Instructions:  Your physician recommends that you continue on your current medications as directed. Please refer to the Current Medication list given to you today.  Your medication refills have been printed and given to you today  If you need a refill on your cardiac medications before your next appointment, please call your pharmacy.   Lab work: None ordered If you have labs (blood work) drawn today and your tests are completely normal, you will receive your results only by: Marland Kitchen MyChart Message (if you have MyChart) OR . A paper copy in the mail If you have any lab test that is abnormal or we need to change your treatment, we will call you to review the results.  Testing/Procedures: None ordered  Follow-Up: At [facility], you and your health needs are our priority.  As part of our continuing mission to provide you with exceptional heart care, we have created designated Provider Care Teams.  These Care Teams include your primary Cardiologist (physician) and Advanced Practice Providers (APPs -  Physician Assistants and Nurse Practitioners) who all work together to provide you with the care you need, when you need it. You will need a follow up appointment in 6 months.  Please call our office 2 months in advance to  schedule this appointment.  You may see  Dr.Kelly or one of the following Advanced Practice Providers on your designated Care Team: Azalee Course, [place] . Micah Flesher, PA-C  Any Other Special Instructions Will Be Listed Below (If Applicable).       Ramond Dial, [place]  12/24/2017 3:46 PM    [facility] [address] [place], [place], [place]  [address] Phone: [phone]; Fax: [phone]

## 2017-12-22 NOTE — Patient Instructions (Signed)
Medication Instructions:  Your physician recommends that you continue on your current medications as directed. Please refer to the Current Medication list given to you today.  Your medication refills have been printed and given to you today  If you need a refill on your cardiac medications before your next appointment, please call your pharmacy.   Lab work: None ordered If you have labs (blood work) drawn today and your tests are completely normal, you will receive your results only by: Marland Kitchen. MyChart Message (if you have MyChart) OR . A paper copy in the mail If you have any lab test that is abnormal or we need to change your treatment, we will call you to review the results.  Testing/Procedures: None ordered  Follow-Up: At [facility], you and your health needs are our priority.  As part of our continuing mission to provide you with exceptional heart care, we have created designated Provider Care Teams.  These Care Teams include your primary Cardiologist (physician) and Advanced Practice Providers (APPs -  Physician Assistants and Nurse Practitioners) who all work together to provide you with the care you need, when you need it. You will need a follow up appointment in 6 months.  Please call our office 2 months in advance to schedule this appointment.  You may see  Dr.Kelly or one of the following Advanced Practice Providers on your designated Care Team: Azalee CourseHao Meng, [place]-C . Micah FlesherAngela Duke, PA-C  Any Other Special Instructions Will Be Listed Below (If Applicable).

## 2017-12-24 ENCOUNTER — Encounter: Payer: Self-pay | Admitting: Physician Assistant

## 2018-01-02 IMAGING — DX DG CHEST 2V
2 series · 2 of 2 positions shown · non-contrast
Comparison: None.

CLINICAL DATA: Cough and fatigue for 3 weeks.

EXAM:
CHEST  2 VIEW

[chest pa]
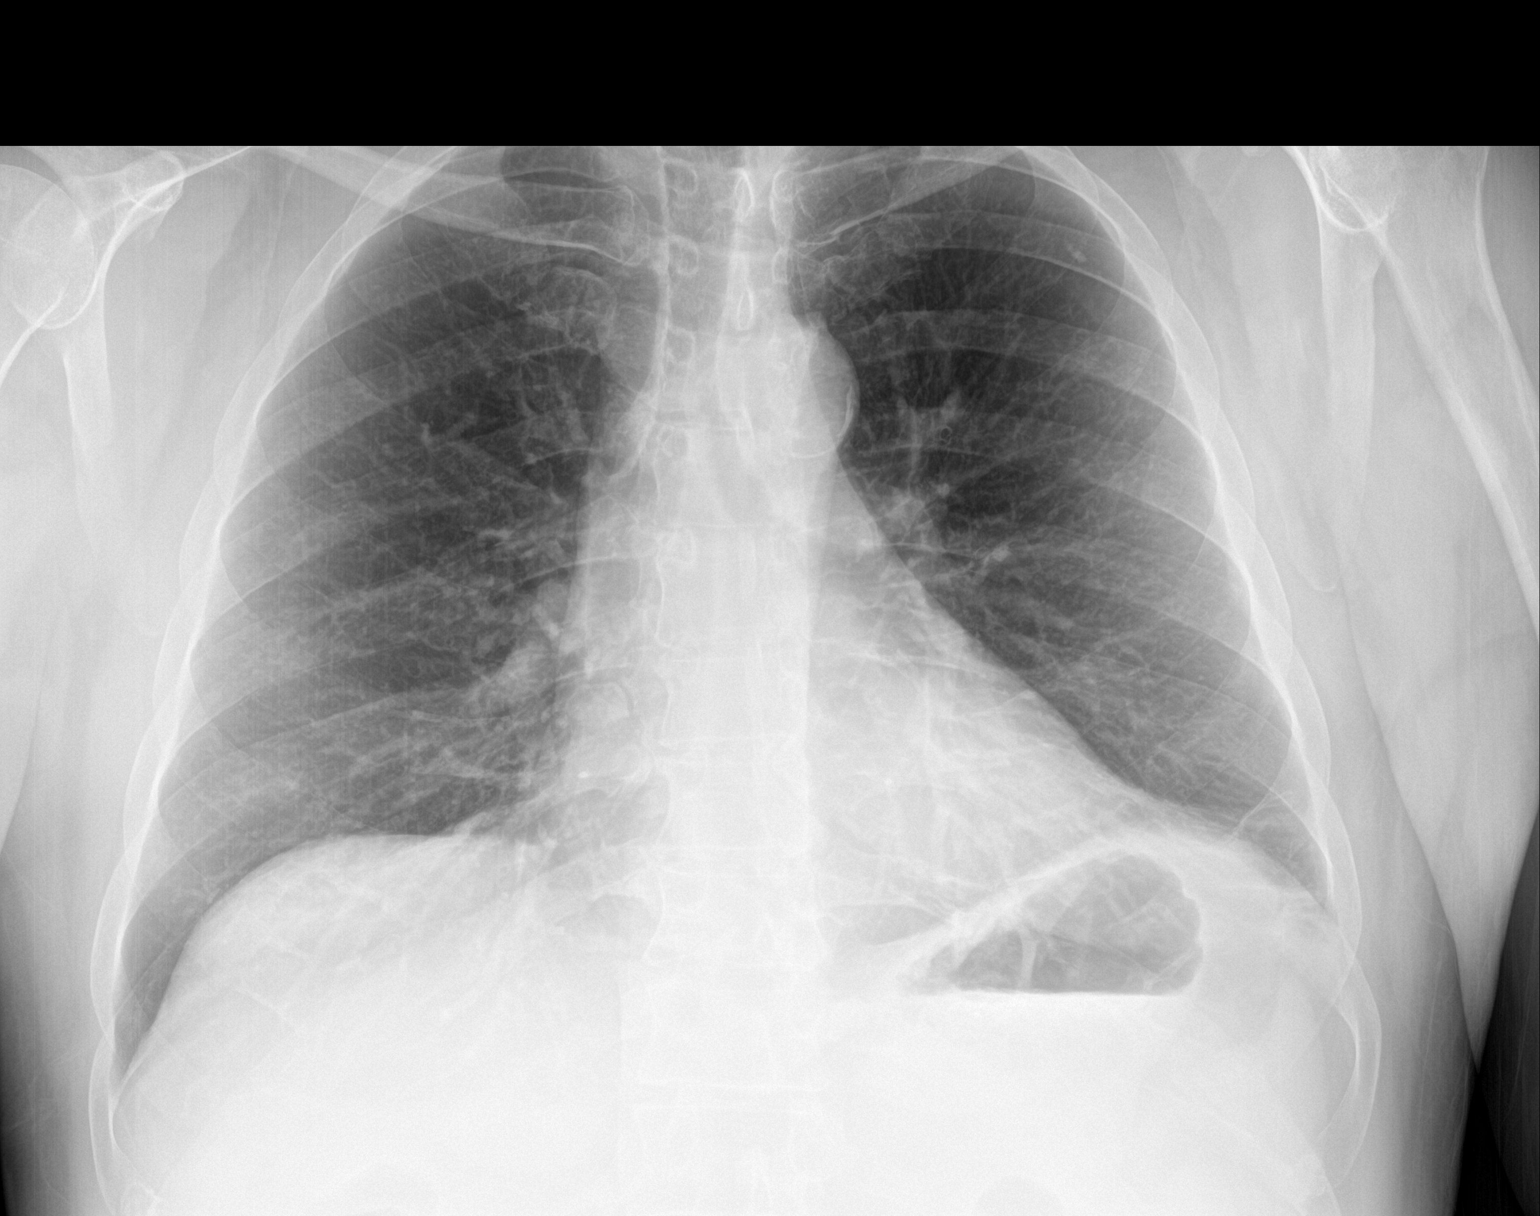

[chest lat]
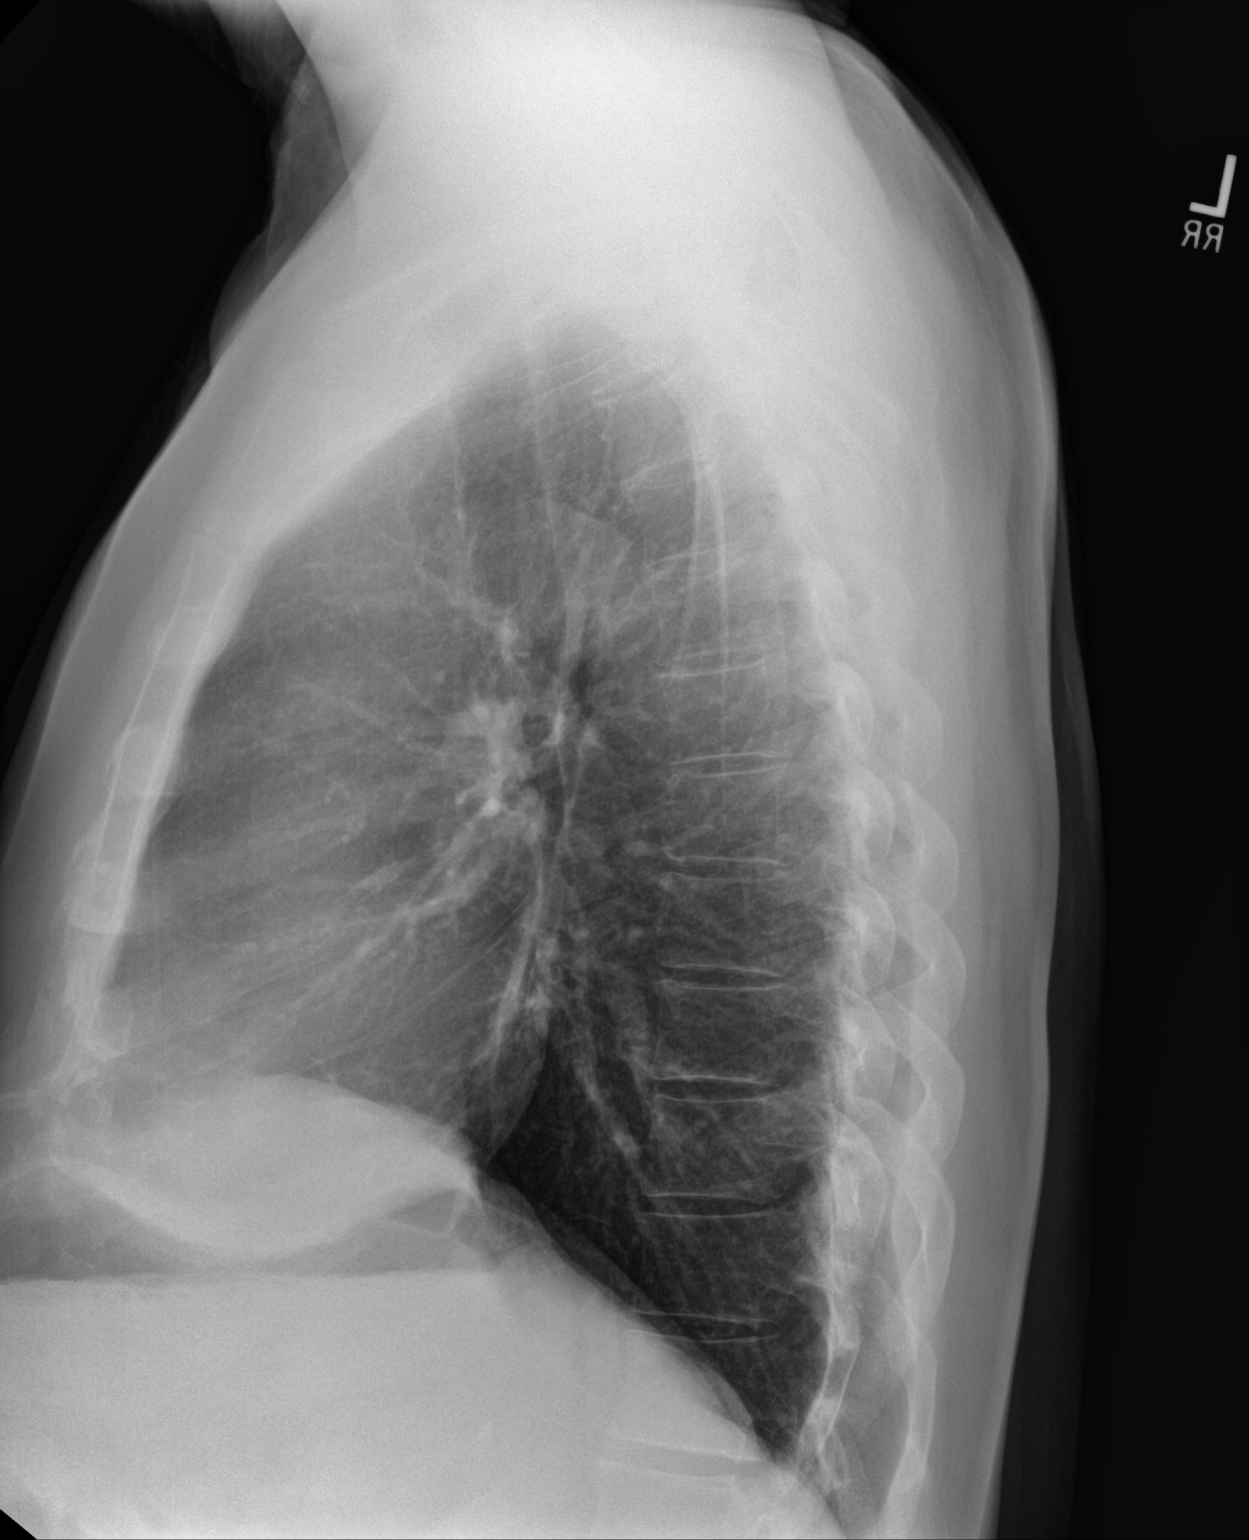

[2 of 2 positions shown; findings below may reference images not displayed]

FINDINGS: Minimal linear atelectasis or scar is seen in the left lung base.
The lungs are otherwise clear. Heart size is normal. No pneumothorax
or pleural effusion. Aortic atherosclerosis is noted. No bony
abnormality.
IMPRESSION: No acute disease.

Atherosclerosis.

## 2018-02-17 NOTE — Telephone Encounter (Signed)
Closing this old note

## 2018-04-04 ENCOUNTER — Encounter: Payer: 59 | Admitting: Family Medicine

## 2018-05-27 ENCOUNTER — Telehealth: Payer: Self-pay | Admitting: Cardiovascular Disease

## 2018-05-27 NOTE — Telephone Encounter (Signed)
Smartphone/ my chart/ consent/ pre reg completed °

## 2018-05-27 NOTE — Telephone Encounter (Signed)
LMTCB to change visit with Dr. Kelly to telephone or video. 

## 2018-05-30 ENCOUNTER — Telehealth: Payer: Self-pay | Admitting: Cardiovascular Disease

## 2018-05-30 ENCOUNTER — Telehealth: Payer: 59 | Admitting: Cardiovascular Disease

## 2018-05-30 NOTE — Telephone Encounter (Signed)
New Message    Pt is ready for the call for his appt    Please call

## 2018-05-30 NOTE — Telephone Encounter (Signed)
Patient called again, stating he doesn't get cell phone service in his house.  He had to drive 2-3 miles from his house to get service.  He wants to reschedule his appt to when he can come in to see Dr. Tresa Endo in person.

## 2018-05-30 NOTE — Telephone Encounter (Signed)
Attempted to contact patient to have his appointment moved if he wanted- advised that those appointment would not be until August- September, gave call back number to discuss.

## 2019-01-19 ENCOUNTER — Other Ambulatory Visit: Payer: Self-pay

## 2019-01-19 ENCOUNTER — Encounter: Payer: Self-pay | Admitting: Cardiovascular Disease

## 2019-01-19 ENCOUNTER — Ambulatory Visit (INDEPENDENT_AMBULATORY_CARE_PROVIDER_SITE_OTHER): Payer: 59 | Admitting: Cardiovascular Disease

## 2019-01-19 VITALS — BP 126/83 | HR 79 | Temp 97.0°F | Ht 68.0 in | Wt 210.0 lb

## 2019-01-19 DIAGNOSIS — I2510 Atherosclerotic heart disease of native coronary artery without angina pectoris: Secondary | ICD-10-CM | POA: Diagnosis not present

## 2019-01-19 DIAGNOSIS — E782 Mixed hyperlipidemia: Secondary | ICD-10-CM

## 2019-01-19 DIAGNOSIS — E6609 Other obesity due to excess calories: Secondary | ICD-10-CM | POA: Diagnosis not present

## 2019-01-19 DIAGNOSIS — I10 Essential (primary) hypertension: Secondary | ICD-10-CM

## 2019-01-19 DIAGNOSIS — Z6831 Body mass index (BMI) 31.0-31.9, adult: Secondary | ICD-10-CM

## 2019-01-19 MED ORDER — LOSARTAN POTASSIUM 25 MG PO TABS: 25.0000 mg | ORAL_TABLET | Freq: Every day | ORAL | 3 refills | Status: AC

## 2019-01-19 MED ORDER — AMLODIPINE BESYLATE 5 MG PO TABS: 7.5000 mg | ORAL_TABLET | Freq: Every day | ORAL | 3 refills | Status: AC

## 2019-01-19 MED ORDER — METOPROLOL SUCCINATE ER 50 MG PO TB24: 50.0000 mg | ORAL_TABLET | Freq: Every day | ORAL | 3 refills | Status: AC

## 2019-01-19 NOTE — Progress Notes (Signed)
Patient ID: Harold Newman, male   DOB: 06/27/1955, 63 y.o.   MRN: [id number]      HPI: Harold Newman is a 63 y.o. male who presents to the office today for a 19 month follow-up cardiology evaluation.    Mr. Bas has a history of significant hyperlipidemia with markedly elevated small LDL small particles in the past greater than 3000.  In 2007 he was found to have subtotal LAD stenosis as well as total occlusion of the circumflex vessel with collaterals after a nuclear study demonstrated multiple areas of ischemia. He underwent initial intervention to his LAD totally and underwent successful staged intervention to his chronically occluded circumflex vessel with reestablishment of antegrade flow. His last nuclear perfusion study in 2011 continued to show fairly normal perfusion without scar or ischemia.  He has done exceptionally well since that intervention.  However, in February 2015, he noticed a change in symptomatology with a decline in exercise tolerance with exertional dyspnea and also has noticed some mild episodes of chest pain, which is seen to be mild, but similar in quality to his prior discomfort.  I had seen him last year and recommended a subsequent nuclear perfusion study.  He never had this done.  He did have some issues with Plavix in the past causing GI bleeding issues.  He now has been taking nattokinase which he states has alleviated a lot of his symptomatology.  He denies any overt recent GI bleed.  He recently had a URI infection which ultimately cleared after 46 weeks.  During this time, he was not using his regularly he has had in the past.  Laboratory in 2016 revealed a total cholesterol of 123, but his HDL was very low at 20, and his calculated LDL was 75.  In the past.he was noted to have markedly elevated small LDL particles in the setting of very low HDL.  In August 2017 follow-up blood work  showed slight improvement in his lipid status with total cholesterol now 111,  triglycerides 136, HDL 21, VLDL 27, and LDL 63 on his regimen consisting of Crestor 40 mg and he had resumed taking niacin 500 mg as well as fish oil.  He has been taking the full dose aspirin to reduce potential niacin flush.  However, I have suggested he reduce his aspirin to 81 mg.    I last saw him in May 2018 at which time he was doing well from a cardiovascular standpoint.  He specifically denied any exertional chest pain.  At times there was some very mild shortness of breath.  He denied palpitations. He was on Crestor 40 mg, niacin 500 mg, coenzyme Q10, and omega-3 fatty acids for his lipids.  He also takes l-carnitine.  He was on Toprol-XL 50 mg, losartan 25 mg daily, and amlodipine 5 mg for blood pressure and his CAD.    Since I last saw him, he was evaluated in November 2019 by Almyra Deforest, PA.  Prior to that evaluation he had experienced hematochezia in August 2019 and subsequently underwent colonoscopy with polypectomy with 2 polyps removed.  Subsequently unfortunately he developed bleeding at the site of polypectomy requiring treatment with epinephrine and a suture clip.  He had not had any episodes of recurrent chest pain.  Over the past year, he has seen his primary physician and underwent repeat laboratory in September 2020 which showed a total cholesterol 130, LDL DL 62, and nonfasting triglyceride was 231 with HDL of 22.  Recently, he has  been on amlodipine 1.5 mg, losartan 25 mg daily, metoprolol succinate 50 mg daily for blood pressure control.  He has continued to be on rosuvastatin 40 mg daily in addition to EPA 1000 mg 2 capsules daily.  He takes Prevacid for GI issues.  He continues to be on lorazepam as needed.  He continues to take aspirin 81 mg daily.  He also continues to be on nattokinase which he self prescribed feels has had significant benefit.  Over 19 pandemic, he has been having to work at home.  As result he has not been walking long distances from his car to his office and  as result has become much more sedentary.  He denies episodes of chest pain or palpitations.  He presents for evaluation.  Past Medical History:  Diagnosis Date  . Bipolar disorder ([place])   . CAD (coronary artery disease)   . GERD (gastroesophageal reflux disease)   . Hyperlipidemia   . Hypertension   . Irritable bowel syndrome (IBS)     Past Surgical History:  Procedure Laterality Date  . APPENDECTOMY  10/2000  . CARDIAC CATHETERIZATION  03/17/2005   low-normal LV function, 2 vessel CAD (subtotal occlusion of LAD, stenosis of small 2nd diagonal and with mid LAD narrowing, total occlusion of distal Cfx); PTCA and stent of LAD and 2nd diagonal with 3.5x38m Cypher DES (Dr. TShelva Majestic  . CARDIAC CATHETERIZATION  04/14/2005   2.5x211mCypher DES to L Cfx (Dr. ThShelva Majestic . COLONOSCOPY  08/2011  . NM MYOCAR PERF WALL MOTION  2011   bruce myoview - normal pattern of perfusion, low risk  . TRANSTHORACIC ECHOCARDIOGRAM  2007   mild MR & TR, AV mildly sclerotic    Allergies  Allergen Reactions  . Anticoagulant Compound     PLAVIX: Other reaction(s): Other (See Comments) "Bleeding issues"  . Clopidogrel Other (See Comments)    "Bleeding issues" "Bleeding issues" "Bleeding issues"  . Meloxicam Nausea Only    Current Outpatient Medications  Medication Sig Dispense Refill  . albuterol (PROVENTIL HFA;VENTOLIN HFA) 108 (90 Base) MCG/ACT inhaler Inhale 2 puffs into the lungs every 6 (six) hours as needed for wheezing or shortness of breath.    . Marland KitchenmLODipine (NORVASC) 5 MG tablet Take 1.5 tablets (7.5 mg total) by mouth daily. 135 tablet 3  . aspirin EC 81 MG tablet Take 1 tablet (81 mg total) by mouth daily. 90 tablet 3  . co-enzyme Q-10 30 MG capsule Take 200 mg by mouth daily.    . Cyanocobalamin (VITAMIN B-12 PO) Take 5,000 mg by mouth daily. 5000 i.u.     . Marland Kitchen-Ribose POWD 5 g by Does not apply route daily.    . Marland Kitchenicyclomine (BENTYL) 10 MG capsule TAKE ONE CAPSULE BY MOUTH THREE  TIMES A DAY BEFORE MEALS as needed 60 capsule 2  . lansoprazole (PREVACID) 15 MG capsule Take 30 mg by mouth daily.     . LevOCARNitine L-Tartrate (L-CARNITINE) 500 MG CAPS Take 500 mg by mouth daily.    . Marland KitchenORazepam (ATIVAN) 1 MG tablet Take 0.5-1 tablets (0.5-1 mg total) by mouth daily as needed for anxiety. 30 tablet 4  . losartan (COZAAR) 25 MG tablet Take 1 tablet (25 mg total) by mouth daily. 90 tablet 3  . metoprolol succinate (TOPROL XL) 50 MG 24 hr tablet Take 1 tablet (50 mg total) by mouth daily. Take with or immediately following a meal. 90 tablet 3  . Multiple Vitamins-Minerals (MULTIVITAMIN WITH MINERALS) tablet Take 1  tablet by mouth daily.    Marland Kitchen NAFTIN 2 % CREA Apply 1 application topically as needed.    Marland Kitchen NATTOKINASE PO Take 2,000 mg by mouth.     Marland Kitchen NITROSTAT 0.4 MG SL tablet PLACE ONE TABLET UNDER THE TONGUE EVERY FIVE MINUTES AS NEEDED FOR CHEST PAIN x 3 doses 25 tablet 4  . Omega-3 Fatty Acids (FISH OIL PO) Take 2 g by mouth daily.    . rosuvastatin (CRESTOR) 40 MG tablet Take 1 tablet (40 mg total) by mouth daily. 90 tablet 3  . tadalafil (CIALIS) 20 MG tablet Take 0.5 tablets (10 mg total) by mouth daily as needed for erectile dysfunction. 40 tablet 0  . valACYclovir (VALTREX) 500 MG tablet Take 1 tablet (500 mg total) by mouth daily. Increase to bid x 5 days PRN outbreak 100 tablet 3   No current facility-administered medications for this visit.    Social History   Socioeconomic History  . Marital status: Married    Spouse name: Not on file  . Number of children: 3  . Years of education: 81  . Highest education level: Not on file  Occupational History  . 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 Smoking status: Never Smoker  . Smokeless tobacco: Never Used  Substance and Sexual Activity  . Alcohol use: Yes    Alcohol/week: 1.0 standard drinks    Types: 1 Glasses of wine per week    Comment: rarely   . Drug use: No  . Sexual  activity: Yes  Other Topics Concern  . 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      Exercise:  Walking 3 times per week 1-2 miles      Seatbelt: 100%; no texting while driving.   Social Determinants of Health   Financial Resource Strain:   . Difficulty of Paying Living Expenses: Not on file  Food Insecurity:   . Worried About Charity fundraiser in the Last Year: Not on file  . Ran Out of Food in the Last Year: Not on file  Transportation Needs:   . Lack of Transportation (Medical): Not on file  . Lack of Transportation (Non-Medical): Not on file  Physical Activity:   . Days of Exercise per Week: Not on file  . Minutes of Exercise per Session: Not on file  Stress:   . Feeling of Stress : Not on file  Social Connections:   . Frequency of Communication with Friends and Family: Not on file  . Frequency of Social Gatherings with Friends and Family: Not on file  . Attends Religious Services: Not on file  . Active Member of Clubs or Organizations: Not on file  . Attends Archivist Meetings: Not on file  . Marital Status: Not on file  Intimate Partner Violence:   . Fear of Current or Ex-Partner: Not on file  . Emotionally Abused: Not on file  . Physically Abused: Not on file  . Sexually Abused: Not on file   Socially, he is now into a having been divorced for many years. He  has 3 children 2 grandchildren. No tobacco or alcohol use.  Family History  Problem Relation Age of Onset  . Aplastic anemia Mother   . Hypertension Mother   . Heart failure Father   . Heart disease Father        CHF  . Alcohol abuse Father   . Heart attack Maternal Grandfather   . Brain cancer Brother   . Alcohol abuse Brother    . Gout Brother   . Hypertension Brother   . Parkinson's disease Brother   . Cancer Brother 28       brain tumor    ROS General: Negative; No fevers, chills, or night sweats;  HEENT: Negative; No changes in vision or hearing, sinus congestion, difficulty swallowing Pulmonary: Negative; No cough, wheezing, shortness of breath, hemoptysis Cardiovascular: See HPI;  GI: Positive for GERD; No nausea, vomiting, diarrhea, or abdominal pain GU: Negative; No dysuria, hematuria, or difficulty voiding Musculoskeletal: Negative; no myalgias, joint pain, or weakness Hematologic/Oncology: Negative; no easy bruising, bleeding Endocrine: Negative; no heat/cold intolerance; no diabetes Neuro: Negative; no changes in balance, headaches Skin: Negative; No rashes or skin lesions Psychiatric: Positive for bipolar disorder, stable on medical therapy No behavioral problems, depression Sleep: Negative; No snoring, daytime sleepiness, hypersomnolence, bruxism, restless legs, hypnogognic hallucinations, no cataplexy Other comprehensive 14 point system review is negative.   PE BP 126/83   Pulse 79   Temp (!) 97 F (36.1 C)   Ht 5' 8" (1.727 m)   Wt 210 lb (95.3 kg)   BMI 31.93 kg/m    Repeat blood pressure by me was 120/70  Wt Readings from Last 3 Encounters:  01/19/19 210 lb (95.3 kg)  12/22/17 209 lb (94.8 kg)  04/19/17 207 lb 9.6 oz (94.2 kg)     Physical Exam BP 126/83   Pulse 79   Temp (!) 97 F (36.1 C)   Ht 5' 8" (1.727 m)   Wt 210 lb (95.3 kg)   BMI 31.93 kg/m  General: Alert, oriented, no distress.  Skin: normal turgor, no rashes, warm and dry HEENT: Normocephalic, atraumatic. Pupils equal round and reactive to light; sclera anicteric; extraocular muscles intact;  Nose without nasal septal hypertrophy Mouth/Parynx benign; Mallinpatti scale 3 Neck: Neck; no JVD, no carotid bruits; normal carotid upstroke Lungs: clear to ausculatation and percussion; no wheezing or rales Chest  wall: without tenderness to palpitation Heart: PMI not displaced, RRR, s1 s2 normal, 1/6 systolic murmur, no diastolic murmur, no rubs, gallops, thrills, or heaves Abdomen: Moderate central obesity;  soft, nontender; no hepatosplenomehaly, BS+; abdominal aorta nontender and not dilated by palpation. Back: no CVA tenderness Pulses 2+ Musculoskeletal: full range of motion, normal strength, no joint deformities Extremities: no clubbing cyanosis or edema, Homan's sign negative  Neurologic: grossly nonfocal; Cranial nerves grossly wnl Psychologic: Normal mood and affect  ECG (independently read by me): Normal sinus rhythm at 79 bpm.  No significant ST changes.  Normal intervals.  No ectopy  May 2018 ECG (independently read by me): Normal sinus rhythm at 67 bpm.  PR interval 186 ms, QTc interval 420 ms.  No syncope in ST-T changes.  October 2017 ECG (independently read by me): Normal sinus rhythm at 73 bpm.  Nonspecific ST changes.  QTc interval 420 ms.  ECG (independently read by me): Normal sinus rhythm at 69 bpm.  No ectopy.  Normal intervals.  No significant ST segment changes  June 2015 ECG (independently read by me): Normal sinus rhythm at 63 beats  per minute.  Small Q-wave in lead 3.  Normal intervals.  Prior December 2014 ECG: Sinus rhythm at 69 beats per minute. Normal intervals. No ectopy.  LABS: BMP Latest Ref Rng & Units 11/06/2017 04/19/2017 06/27/2016  Glucose 70 - 99 mg/dL 106(H) 91 87  BUN 8 - 23 mg/dL _0 Creatinine 0.61 - 1.24 mg/dL 0.78 0.87 0.96  BUN/Creat Ratio 10 - 24 - 15 11  Sodium 135 - 145 mmol/L 135 141 139  Potassium 3.5 - 5.1 mmol/L 4.1 4.6 4.4  Chloride 98 - 111 mmol/L 103 101 100  CO2 22 - 32 mmol/L _1 Calcium 8.9 - 10.3 mg/dL 9.3 9.3 9.6   Hepatic Function Latest Ref Rng & Units 04/19/2017 06/27/2016 09/07/2015  Total Protein 6.0 - 8.5 g/dL 6.6 7.3 6.6  Albumin 3.6 - 4.8 g/dL 4.7 4.6 4.3  AST 0 - 40 IU/L _2 ALT 0 - 44 IU/L _3 Alk  Phosphatase 39 - 117 IU/L 64 63 58  Total Bilirubin 0.0 - 1.2 mg/dL 0.5 0.7 0.7   CBC Latest Ref Rng & Units 11/06/2017 11/06/2017 04/19/2017  WBC 4.0 - 10.5 K/uL - 6.4 7.0  Hemoglobin 13.0 - 17.0 g/dL 14.5 14.8 15.4  Hematocrit 39.0 - 52.0 % 43.7 44.7 46.4  Platelets 150 - 400 K/uL - 153 145(L)   Lab Results  Component Value Date   MCV 88.0 11/06/2017   MCV 91 04/19/2017   MCV 91.3 11/07/2016   Lab Results  Component Value Date   TSH 2.710 06/27/2016     Lipid Panel     Component Value Date/Time   CHOL 124 04/19/2017 1509   TRIG 236 (H) 04/19/2017 1509   HDL 23 (L) 04/19/2017 1509   CHOLHDL 5.4 (H) 04/19/2017 1509   CHOLHDL 5.3 (H) 09/07/2015 1035   VLDL 27 09/07/2015 1035   LDLCALC 54 04/19/2017 1509   RADIOLOGY: No results found.  IMPRESSION:  1. Coronary artery disease involving native coronary artery of native heart without angina pectoris   2. Essential hypertension   3. Mixed hyperlipidemia   4. Class 1 obesity due to excess calories with serious comorbidity and body mass index (BMI) of 31.0 to 31.9 in adult     ASSESSMENT AND PLAN: Mr. Alexes Lamarque is a 63 year old Caucasian male who is 13 years s/p two-vessel intervention to a subtotal LAD and chronic total occlusion of the circumflex vessel with successful reperfusion in 2007. Subsequent nuclear studies have shown normal perfusion with his last study in 2011.  I titrated his Toprol to 50 mg in 2016 and had recommended a subsequent stress test which he never had.  When I saw him last year he remained asymptomatic with reference to chest pain or shortness of breath.  Over the years since his intervention, he denies recurrent anginal symptomatology.  Since I last saw him, he has gained some additional weight.  He has not been walking as he had in the past particularly with working at home and not walking the typical distance from his car to his office which often would be 1/2 mile to a mile round trip.  His blood  pressure today is stable on his current regimen consisting of amlodipine 7.5 mg, losartan 25 mg, and Toprol-XL 50 mg daily.  He continues to be on combination therapy for his mixed hyperlipidemia and is now on rosuvastatin 40 mg in addition to a generic equivalent of Vascepa and he has been  taking 2000 mg daily.  With his most recent triglycerides of 231 although this may have been completely nonfasting, I have recommended titration to 2 capsules twice daily.  LDL cholesterol is stable at 62.  HDL is low at 22.  He did have bleeding following polypectomy and ultimately required epinephrine and a suture clip for stability.  BMI is 31.93 consistent with obesity.  I discussed the importance of weight loss.  I discussed the importance of increasing exercise at least 5 days/week for minimum of 30 minutes of moderate intensity if at all possible.  As long as he remains stable I will see him in 1 year for reevaluation or sooner as needed.  Time spent: 25 minutes Troy Sine, MD, [facility]  01/19/2019 12:53 PM

## 2019-01-19 NOTE — Patient Instructions (Signed)
Medication Instructions:  Your physician recommends that you continue on your current medications as directed. Please refer to the Current Medication list given to you today.  *If you need a refill on your cardiac medications before your next appointment, please call your pharmacy*  Lab Work: NONE If you have labs (blood work) drawn today and your tests are completely normal, you will receive your results only by: . MyChart Message (if you have MyChart) OR . A paper copy in the mail If you have any lab test that is abnormal or we need to change your treatment, we will call you to review the results.  Testing/Procedures: NONE  Follow-Up: At [facility], you and your health needs are our priority.  As part of our continuing mission to provide you with exceptional heart care, we have created designated Provider Care Teams.  These Care Teams include your primary Cardiologist (physician) and Advanced Practice Providers (APPs -  Physician Assistants and Nurse Practitioners) who all work together to provide you with the care you need, when you need it.  Your next appointment:   12 month(s)  The format for your next appointment:   In Person  Provider:   Thomas Kelly, MD    

## 2019-03-13 ENCOUNTER — Other Ambulatory Visit: Payer: Self-pay

## 2019-03-13 MED ORDER — NITROSTAT 0.4 MG SL SUBL: SUBLINGUAL_TABLET | SUBLINGUAL | 4 refills | Status: DC

## 2019-04-15 ENCOUNTER — Other Ambulatory Visit: Payer: Self-pay | Admitting: Physician Assistant

## 2020-01-03 ENCOUNTER — Other Ambulatory Visit: Payer: Self-pay | Admitting: Physician Assistant

## 2020-03-18 ENCOUNTER — Ambulatory Visit: Payer: 59 | Admitting: Cardiovascular Disease

## 2020-03-18 ENCOUNTER — Other Ambulatory Visit: Payer: Self-pay

## 2020-03-18 ENCOUNTER — Ambulatory Visit (INDEPENDENT_AMBULATORY_CARE_PROVIDER_SITE_OTHER): Payer: 59 | Admitting: Cardiovascular Disease

## 2020-03-18 ENCOUNTER — Encounter: Payer: Self-pay | Admitting: Cardiovascular Disease

## 2020-03-18 DIAGNOSIS — I2510 Atherosclerotic heart disease of native coronary artery without angina pectoris: Secondary | ICD-10-CM

## 2020-03-18 DIAGNOSIS — E782 Mixed hyperlipidemia: Secondary | ICD-10-CM

## 2020-03-18 DIAGNOSIS — E669 Obesity, unspecified: Secondary | ICD-10-CM | POA: Diagnosis not present

## 2020-03-18 DIAGNOSIS — I10 Essential (primary) hypertension: Secondary | ICD-10-CM

## 2020-03-18 NOTE — Patient Instructions (Signed)
Medication Instructions:  Your physician recommends that you continue on your current medications as directed. Please refer to the Current Medication list given to you today.  *If you need a refill on your cardiac medications before your next appointment, please call your pharmacy*  Lab Work: NONE  Testing/Procedures: NONE  Follow-Up: At [facility], you and your health needs are our priority.  As part of our continuing mission to provide you with exceptional heart care, we have created designated Provider Care Teams.  These Care Teams include your primary Cardiologist (physician) and Advanced Practice Providers (APPs -  Physician Assistants and Nurse Practitioners) who all work together to provide you with the care you need, when you need it.  We recommend signing up for the patient portal called "MyChart".  Sign up information is provided on this After Visit Summary.  MyChart is used to connect with patients for Virtual Visits (Telemedicine).  Patients are able to view lab/test results, encounter notes, upcoming appointments, etc.  Non-urgent messages can be sent to your provider as well.   To learn more about what you can do with MyChart, go to [email or web address].    Your next appointment:   12 month(s)  The format for your next appointment:   In Person  Provider:   You may see DR KELLY  or one of the following Advanced Practice Providers on your designated Care Team:    Hao Meng, PA-C  Angela Duke, PA-C or   Krista Kroeger, PA-C     

## 2020-03-18 NOTE — Progress Notes (Signed)
Patient ID: Harold Newman, male   DOB: 07/14/1955, 65 y.o.   MRN: [id number]      HPI: Harold Newman is a 65 y.o. male who presents to the office today for a 14 month follow-up cardiology evaluation.    Harold Newman has a history of significant hyperlipidemia with markedly elevated small LDL small particles in the past greater than 3000.  In 2007 he was found to have subtotal LAD stenosis as well as total occlusion of the circumflex vessel with collaterals after a nuclear study demonstrated multiple areas of ischemia. He underwent initial intervention to his LAD totally and underwent successful staged intervention to his chronically occluded circumflex vessel with reestablishment of antegrade flow. His last nuclear perfusion study in 2011 continued to show fairly normal perfusion without scar or ischemia.  He has done exceptionally well since that intervention.  However, in February 2015, he noticed a change in symptomatology with a decline in exercise tolerance with exertional dyspnea and also has noticed some mild episodes of chest pain, which is seen to be mild, but similar in quality to his prior discomfort.  I had seen him last year and recommended a subsequent nuclear perfusion study.  He never had this done.  He did have some issues with Plavix in the past causing GI bleeding issues.  He now has been taking nattokinase which he states has alleviated a lot of his symptomatology.  He denies any overt recent GI bleed.  He recently had a URI infection which ultimately cleared after 46 weeks.  During this time, he was not using his regularly he has had in the past.  Laboratory in 2016 revealed a total cholesterol of 123, but his HDL was very low at 20, and his calculated LDL was 75.  In the past.he was noted to have markedly elevated small LDL particles in the setting of very low HDL.  In August 2017 follow-up blood work  showed slight improvement in his lipid status with total cholesterol now 111,  triglycerides 136, HDL 21, VLDL 27, and LDL 63 on his regimen consisting of Crestor 40 mg and he had resumed taking niacin 500 mg as well as fish oil.  He has been taking the full dose aspirin to reduce potential niacin flush.  However, I have suggested he reduce his aspirin to 81 mg.    I saw him in May 2018 at which time he was doing well from a cardiovascular standpoint.  He specifically denied any exertional chest pain.  At times there was some very mild shortness of breath.  He denied palpitations. He was on Crestor 40 mg, niacin 500 mg, coenzyme Q10, and omega-3 fatty acids for his lipids.  He also takes l-carnitine.  He was on Toprol-XL 50 mg, losartan 25 mg daily, and amlodipine 5 mg for blood pressure and his CAD.    He was evaluated in November 2019 by Almyra Deforest, PA.  Prior to that evaluation he had experienced hematochezia in August 2019 and subsequently underwent colonoscopy with polypectomy with 2 polyps removed.  Subsequently unfortunately he developed bleeding at the site of polypectomy requiring treatment with epinephrine and a suture clip.  He had not had any episodes of recurrent chest pain.  Over the past year, he has seen his primary physician and underwent repeat laboratory in September 2020 which showed a total cholesterol 130, LDL DL 62, and nonfasting triglyceride was 231 with HDL of 22.  Recently, he has been on amlodipine 1.5 mg, losartan  25 mg daily, metoprolol succinate 50 mg daily for blood pressure control.  He has continued to be on rosuvastatin 40 mg daily in addition to EPA 1000 mg 2 capsules daily.  He takes Prevacid for GI issues.  He continues to be on lorazepam as needed.  He continues to take aspirin 81 mg daily.  He also continues to be on nattokinase which he self prescribed feels has had significant benefit.  I last saw him in December 2020 . During the Covid 19 pandemic, he has been having to work at home.  As result he has not been walking long distances from his car  to his office and as result has become much more sedentary.  He denied episodes of chest pain or palpitations.  His blood pressure was stable.  He was on rosuvastatin 40 mg and was taking 2000 mg of Vascepa.  Triglycerides remain elevated and I recommended further titration to 2 capsules twice a day.  Since I last saw him, he has remained stable.  He will be retiring in the next 2 months.  During the week he lives in [place].  He denies chest pain.  He does have a lesion on his tongue which intermittently bleeds and may require surgery.  He also had an episode of epididymitis and has been followed by urology.  He states his most recent labs were from his primary physician in September/October 2021 and total cholesterol was 101 from direct LDL 52.  HDL remains low at 22.  He presents for evaluation.  Past Medical History:  Diagnosis Date  . Bipolar disorder (HCC)   . CAD (coronary artery disease)   . GERD (gastroesophageal reflux disease)   . Hyperlipidemia   . Hypertension   . Irritable bowel syndrome (IBS)     Past Surgical History:  Procedure Laterality Date  . APPENDECTOMY  10/2000  . CARDIAC CATHETERIZATION  03/17/2005   low-normal LV function, 2 vessel CAD (subtotal occlusion of LAD, stenosis of small 2nd diagonal and with mid LAD narrowing, total occlusion of distal Cfx); PTCA and stent of LAD and 2nd diagonal with 3.5x7mm Cypher DES (Dr. Nicki Guadalajara)  . CARDIAC CATHETERIZATION  04/14/2005   2.5x58mm Cypher DES to L Cfx (Dr. Nicki Guadalajara)  . COLONOSCOPY  08/2011  . NM MYOCAR PERF WALL MOTION  2011   bruce myoview - normal pattern of perfusion, low risk  . TRANSTHORACIC ECHOCARDIOGRAM  2007   mild MR & TR, AV mildly sclerotic    Allergies  Allergen Reactions  . Anticoagulant Compound     PLAVIX: Other reaction(s): Other (See Comments) "Bleeding issues"  . Clopidogrel Other (See Comments)    "Bleeding issues" "Bleeding issues" "Bleeding issues"  . Meloxicam Nausea Only     Current Outpatient Medications  Medication Sig Dispense Refill  . albuterol (PROVENTIL HFA;VENTOLIN HFA) 108 (90 Base) MCG/ACT inhaler Inhale 2 puffs into the lungs every 6 (six) hours as needed for wheezing or shortness of breath.    Marland Kitchen amLODipine (NORVASC) 5 MG tablet Take 1.5 tablets (7.5 mg total) by mouth daily. 135 tablet 3  . aspirin EC 81 MG tablet Take 1 tablet (81 mg total) by mouth daily. 90 tablet 3  . co-enzyme Q-10 30 MG capsule Take 200 mg by mouth daily.    . Cyanocobalamin (VITAMIN B-12 PO) Take 5,000 mg by mouth daily. 5000 i.u.    Marland Kitchen D-Ribose POWD 5 g by Does not apply route daily.    Marland Kitchen dicyclomine (BENTYL) 10 MG  capsule TAKE ONE CAPSULE BY MOUTH THREE TIMES A DAY BEFORE MEALS as needed 60 capsule 2  . lansoprazole (PREVACID) 15 MG capsule Take 30 mg by mouth daily.     . LevOCARNitine L-Tartrate (L-CARNITINE) 500 MG CAPS Take 500 mg by mouth daily.    Marland Kitchen LORazepam (ATIVAN) 1 MG tablet Take 0.5-1 tablets (0.5-1 mg total) by mouth daily as needed for anxiety. 30 tablet 4  . losartan (COZAAR) 25 MG tablet Take 1 tablet (25 mg total) by mouth daily. 90 tablet 3  . metoprolol succinate (TOPROL XL) 50 MG 24 hr tablet Take 1 tablet (50 mg total) by mouth daily. Take with or immediately following a meal. 90 tablet 3  . Multiple Vitamins-Minerals (MULTIVITAMIN WITH MINERALS) tablet Take 1 tablet by mouth daily.    Marland Kitchen NAFTIN 2 % CREA Apply 1 application topically as needed.    Marland Kitchen NATTOKINASE PO Take 2,000 mg by mouth.     Marland Kitchen NITROSTAT 0.4 MG SL tablet PLACE ONE TABLET UNDER THE TONGUE EVERY FIVE MINUTES AS NEEDED FOR CHEST PAIN x 3 doses 25 tablet 4  . Omega-3 Fatty Acids (FISH OIL PO) Take 2 g by mouth daily.    . pantoprazole (PROTONIX) 40 MG tablet Take by mouth.    . rosuvastatin (CRESTOR) 40 MG tablet TAKE ONE TABLET BY MOUTH DAILY 90 tablet 1  . tadalafil (CIALIS) 20 MG tablet Take 0.5 tablets (10 mg total) by mouth daily as needed for erectile dysfunction. 40 tablet 0  .  valACYclovir (VALTREX) 500 MG tablet Take 1 tablet (500 mg total) by mouth daily. Increase to bid x 5 days PRN outbreak 100 tablet 3   No current facility-administered medications for this visit.    Social History   Socioeconomic History  . Marital status: Married    Spouse name: Not on file  . Number of children: 3  . Years of education: 61  . Highest education level: Not on file  Occupational History  . 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 Smoking status: Never Smoker  . Smokeless tobacco: Never Used  Vaping Use  . Vaping Use: Never used  Substance and Sexual Activity  . Alcohol use: Yes    Alcohol/week: 1.0 standard drink    Types: 1 Glasses of wine per week    Comment: rarely   . Drug use: No  . Sexual activity: Yes  Other Topics Concern  . 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      Exercise:  Walking 3 times per week 1-2 miles      Seatbelt: 100%; no texting while driving.   Social Determinants of Health   Financial Resource Strain: Not on file  Food Insecurity: Not on file  Transportation Needs: Not on file  Physical Activity: Not on file  Stress: Not on file  Social Connections: Not on file  Intimate Partner Violence: Not on file   Socially, he is  divorced for many years. He has 3 children 2 grandchildren. No tobacco or alcohol use.  Family History  Problem Relation Age of  Onset  . Aplastic anemia Mother   . Hypertension Mother   . Heart failure Father   . Heart disease Father        CHF  . Alcohol abuse Father   . Heart attack Maternal Grandfather   . Brain cancer Brother   . Alcohol abuse Brother   . Gout Brother   . Hypertension Brother   .  Parkinson's disease Brother   . Cancer Brother 13       brain tumor    ROS General: Negative; No fevers, chills, or night sweats;  HEENT: Negative; No changes in vision or hearing, sinus congestion, difficulty swallowing Pulmonary: Negative; No cough, wheezing, shortness of breath, hemoptysis Cardiovascular: See HPI;  GI: Positive for GERD; No nausea, vomiting, diarrhea, or abdominal pain GU: Recent epididymitis Musculoskeletal: Negative; no myalgias, joint pain, or weakness Hematologic/Oncology: Negative; no easy bruising, bleeding Endocrine: Negative; no heat/cold intolerance; no diabetes Neuro: Negative; no changes in balance, headaches Skin: Negative; No rashes or skin lesions Psychiatric: Positive for bipolar disorder, stable on medical therapy No behavioral problems, depression Sleep: Negative; No snoring, daytime sleepiness, hypersomnolence, bruxism, restless legs, hypnogognic hallucinations, no cataplexy Other comprehensive 14 point system review is negative.   PE BP 125/75   Pulse 65   Ht $R'5\' 8"'wO$  (1.727 m)   Wt 208 lb (94.3 kg)   SpO2 99%   BMI 31.63 kg/m    Repeat blood pressure by me 122/70  Wt Readings from Last 3 Encounters:  03/18/20 208 lb (94.3 kg)  01/19/19 210 lb (95.3 kg)  12/22/17 209 lb (94.8 kg)   General: Alert, oriented, no distress.  Skin: normal turgor, no rashes, warm and dry HEENT: Normocephalic, atraumatic. Pupils equal round and reactive to light; sclera anicteric; extraocular muscles intact;  Nose without nasal septal hypertrophy Mouth/Parynx benign; Mallinpatti scale 3 Neck: No JVD, no carotid bruits; normal carotid upstroke Lungs: clear to ausculatation and percussion; no wheezing or rales Chest wall: without tenderness to palpitation Heart: PMI not displaced, RRR, s1 s2 normal, 1/6 systolic murmur, no diastolic murmur, no rubs, gallops, thrills, or heaves Abdomen: soft, nontender; no hepatosplenomehaly, BS+; abdominal aorta nontender and  not dilated by palpation. Back: no CVA tenderness Pulses 2+ Musculoskeletal: full range of motion, normal strength, no joint deformities Extremities: no clubbing cyanosis or edema, Homan's sign negative  Neurologic: grossly nonfocal; Cranial nerves grossly wnl Psychologic: Normal mood and affect   ECG (independently read by me): NSR at 65; Nonspecific ST change; no ectopy.  December 2020 ECG (independently read by me): Normal sinus rhythm at 79 bpm.  No significant ST changes.  Normal intervals.  No ectopy  May 2018 ECG (independently read by me): Normal sinus rhythm at 67 bpm.  PR interval 186 ms, QTc interval 420 ms.  No syncope in ST-T changes.  October 2017 ECG (independently read by me): Normal sinus rhythm at 73 bpm.  Nonspecific ST changes.  QTc interval 420 ms.  ECG (independently read by me): Normal sinus rhythm at 69 bpm.  No ectopy.  Normal intervals.  No significant ST segment changes  June 2015 ECG (independently read by me): Normal sinus rhythm at 63 beats per minute.  Small Q-wave in lead 3.  Normal intervals.  Prior December 2014 ECG: Sinus rhythm at 69 beats per minute. Normal intervals. No ectopy.  LABS: BMP Latest Ref Rng & Units 11/06/2017 04/19/2017 06/27/2016  Glucose 70 - 99 mg/dL 106(H) 91 87  BUN 8 - 23 mg/dL 17 13 11  Creatinine 0.61 - 1.24 mg/dL 0.78 0.87 0.96  BUN/Creat Ratio 10 - 24 - 15 11  Sodium 135 - 145 mmol/L 135 141 139  Potassium 3.5 - 5.1 mmol/L 4.1 4.6 4.4  Chloride 98 - 111 mmol/L 103 101 100  CO2 22 - 32 mmol/L $RemoveB'23 24 24  'rDwOHKzO$ Calcium 8.9 - 10.3 mg/dL 9.3 9.3 9.6   Hepatic Function Latest Ref Rng & Units 04/19/2017 06/27/2016 09/07/2015  Total Protein 6.0 - 8.5 g/dL 6.6 7.3 6.6  Albumin 3.6 - 4.8 g/dL 4.7 4.6 4.3  AST 0 - 40 IU/L $Remov'24 27 23  'HdlUHJ$ ALT 0 - 44 IU/L $Remov'20 31 21  'wcHLCR$ Alk Phosphatase 39 - 117 IU/L 64 63 58  Total Bilirubin 0.0 - 1.2 mg/dL 0.5 0.7 0.7   CBC Latest Ref Rng & Units 11/06/2017 11/06/2017 04/19/2017  WBC 4.0 - 10.5 K/uL - 6.4 7.0   Hemoglobin 13.0 - 17.0 g/dL 14.5 14.8 15.4  Hematocrit 39.0 - 52.0 % 43.7 44.7 46.4  Platelets 150 - 400 K/uL - 153 145(L)   Lab Results  Component Value Date   MCV 88.0 11/06/2017   MCV 91 04/19/2017   MCV 91.3 11/07/2016   Lab Results  Component Value Date   TSH 2.710 06/27/2016     Lipid Panel     Component Value Date/Time   CHOL 124 04/19/2017 1509   TRIG 236 (H) 04/19/2017 1509   HDL 23 (L) 04/19/2017 1509   CHOLHDL 5.4 (H) 04/19/2017 1509   CHOLHDL 5.3 (H) 09/07/2015 1035   VLDL 27 09/07/2015 1035   LDLCALC 54 04/19/2017 1509   RADIOLOGY: No results found.  IMPRESSION:  1. Coronary artery disease involving native coronary artery of native heart without angina pectoris   2. Essential hypertension   3. Mixed hyperlipidemia   4. Mild obesity     ASSESSMENT AND PLAN: Harold Newman is a 65 year old Caucasian male who is 15 years s/p two-vessel intervention to a subtotal LAD and chronic total occlusion of the circumflex vessel with successful reperfusion in 2007. Subsequent nuclear studies have shown normal perfusion with his last study in 2011.  I titrated his Toprol to 50 mg in 2016 and had recommended a subsequent stress test which he never had.  He has been symptomatic without chest pain or exertional dyspnea.  He did gain weight as result of decreased exercise during this Covid 19 pandemic.  His blood pressure today is well controlled on his regimen consisting of amlodipine 7.5 mg, losartan 25 mg, metoprolol succinate 50 mg daily.  He continues to be on rosuvastatin 40 mg for hyperlipidemia continues to take fish oil.  He was unaware of his most recent triglyceride level.  BMI is 31.6 consistent with mild obesity.  He tells me he has an area of abrasion on his tongue which intermittently bleeds and has been evaluated but may need to undergo some mild surgery.  He is stable from a cardiac standpoint for this and if necessary aspirin can be held for the procedure if  bleed risk is increased.  Discussed the importance of exercise least 5 days/week for at least 30 minutes if at all possible.  Troy Sine, MD, [facility]  03/20/2020 11:02 AM

## 2020-03-20 ENCOUNTER — Encounter: Payer: Self-pay | Admitting: Cardiovascular Disease

## 2021-02-19 ENCOUNTER — Telehealth: Payer: Self-pay | Admitting: Cardiovascular Disease

## 2021-02-19 NOTE — Telephone Encounter (Signed)
Patient calling to let Dr. Tresa Endo know that he was recently hospitalized at [facility] and wanted to let Dr. Tresa Endo to review those records. Patient states he was recently hospitalized with Covid in December and was given some medications that lowered sodium which resulted in a hospital stay.  Patient also with multiple other issues regarding GI bleeding, and had this managed through [facility]. Patient states that he also lost 25 pounds and wants to let Dr. Tresa Endo know his current weight is 188lb. Advised patient that I would forward message to Dr. Tresa Endo to make him aware.

## 2021-02-19 NOTE — Telephone Encounter (Signed)
Patient had a couple of stays at [facility] during December when he contracted COVID. He wants to make sure that we can pull the EKG's they did prior to his visit next month.   He also wants to let him know that he lost 25lbs.  He is still taking the same medication that were prescribed to him.

## 2021-02-19 NOTE — Telephone Encounter (Signed)
Attempted to call patient, left message for patient to call back to office.   

## 2021-02-26 NOTE — Telephone Encounter (Signed)
Will review at ov with pt.

## 2021-03-07 ENCOUNTER — Ambulatory Visit: Payer: 59 | Admitting: Cardiovascular Disease

## 2021-05-08 ENCOUNTER — Encounter: Payer: Self-pay | Admitting: Cardiovascular Disease

## 2021-05-08 MED ORDER — NITROSTAT 0.4 MG SL SUBL: SUBLINGUAL_TABLET | SUBLINGUAL | 4 refills | Status: AC

## 2021-07-21 ENCOUNTER — Encounter: Payer: Self-pay | Admitting: Cardiovascular Disease

## 2021-07-21 ENCOUNTER — Ambulatory Visit: Payer: Medicare HMO | Admitting: Cardiovascular Disease

## 2021-07-21 VITALS — BP 100/70 | HR 64 | Ht 68.0 in | Wt 199.4 lb

## 2021-07-21 DIAGNOSIS — E785 Hyperlipidemia, unspecified: Secondary | ICD-10-CM | POA: Diagnosis not present

## 2021-07-21 DIAGNOSIS — E782 Mixed hyperlipidemia: Secondary | ICD-10-CM

## 2021-07-21 DIAGNOSIS — I2510 Atherosclerotic heart disease of native coronary artery without angina pectoris: Secondary | ICD-10-CM | POA: Diagnosis not present

## 2021-07-21 DIAGNOSIS — I10 Essential (primary) hypertension: Secondary | ICD-10-CM | POA: Diagnosis not present

## 2021-07-21 DIAGNOSIS — E871 Hypo-osmolality and hyponatremia: Secondary | ICD-10-CM

## 2021-07-21 DIAGNOSIS — K279 Peptic ulcer, site unspecified, unspecified as acute or chronic, without hemorrhage or perforation: Secondary | ICD-10-CM

## 2021-07-21 DIAGNOSIS — U071 COVID-19: Secondary | ICD-10-CM

## 2021-07-21 DIAGNOSIS — E669 Obesity, unspecified: Secondary | ICD-10-CM

## 2021-07-21 NOTE — Progress Notes (Signed)
Patient ID: Harold Newman, male   DOB: 1955-04-02, 66 y.o.   MRN: [id number]      HPI: Harold Newman is a 66 y.o. male who presents to the office today for a 16 month follow-up cardiology evaluation.    Harold Newman has a history of significant hyperlipidemia with markedly elevated small LDL small particles in the past greater than 3000.  In 2007 he was found to have subtotal LAD stenosis as well as total occlusion of the circumflex vessel with collaterals after a nuclear study demonstrated multiple areas of ischemia. He underwent initial intervention to his LAD totally and underwent successful staged intervention to his chronically occluded circumflex vessel with reestablishment of antegrade flow. His last nuclear perfusion study in 2011 continued to show fairly normal perfusion without scar or ischemia.  He has done exceptionally well since that intervention.  However, in February 2015, he noticed a change in symptomatology with a decline in exercise tolerance with exertional dyspnea and also has noticed some mild episodes of chest pain, which is seen to be mild, but similar in quality to his prior discomfort.  I had seen him last year and recommended a subsequent nuclear perfusion study.  He never had this done.  He did have some issues with Plavix in the past causing GI bleeding issues.  He now has been taking nattokinase which he states has alleviated a lot of his symptomatology.  He denies any overt recent GI bleed.  He recently had a URI infection which ultimately cleared after 46 weeks.  During this time, he was not using his regularly he has had in the past.  Laboratory in 2016 revealed a total cholesterol of 123, but his HDL was very low at 20, and his calculated LDL was 75.  In the past.he was noted to have markedly elevated small LDL particles in the setting of very low HDL.  In August 2017 follow-up blood work  showed slight improvement in his lipid status with total cholesterol now 111,  triglycerides 136, HDL 21, VLDL 27, and LDL 63 on his regimen consisting of Crestor 40 mg and he had resumed taking niacin 500 mg as well as fish oil.  He has been taking the full dose aspirin to reduce potential niacin flush.  However, I have suggested he reduce his aspirin to 81 mg.    I saw him in May 2018 at which time he was doing well from a cardiovascular standpoint.  He specifically denied any exertional chest pain.  At times there was some very mild shortness of breath.  He denied palpitations. He was on Crestor 40 mg, niacin 500 mg, coenzyme Q10, and omega-3 fatty acids for his lipids.  He also takes l-carnitine.  He was on Toprol-XL 50 mg, losartan 25 mg daily, and amlodipine 5 mg for blood pressure and his CAD.    He was evaluated in November 2019 by Harold Deforest, PA.  Prior to that evaluation he had experienced hematochezia in August 2019 and subsequently underwent colonoscopy with polypectomy with 2 polyps removed.  Subsequently unfortunately he developed bleeding at the site of polypectomy requiring treatment with epinephrine and a suture clip.  He had not had any episodes of recurrent chest pain.  Over the past year, he has seen his primary physician and underwent repeat laboratory in September 2020 which showed a total cholesterol 130, LDL DL 62, and nonfasting triglyceride was 231 with HDL of 22.  Recently, he has been on amlodipine 1.5 mg, losartan  25 mg daily, metoprolol succinate 50 mg daily for blood pressure control.  He has continued to be on rosuvastatin 40 mg daily in addition to EPA 1000 mg 2 capsules daily.  He takes Prevacid for GI issues.  He continues to be on lorazepam as needed.  He continues to take aspirin 81 mg daily.  He also continues to be on nattokinase which he self prescribed feels has had significant benefit.  I saw him in December 2020 . During the Covid 19 pandemic, he has been having to work at home.  As result he has not been walking long distances from his car to  his office and as result has become much more sedentary.  He denied episodes of chest pain or palpitations.  His blood pressure was stable.  He was on rosuvastatin 40 mg and was taking 2000 mg of Vascepa.  Triglycerides remain elevated and I recommended further titration to 2 capsules twice a day.  I last saw him on March 18, 2020 at which time he remained stable.   He will be retiring in the next 2 months.  During the week he lives in [place].  He denies chest pain.  He does have a lesion on his tongue which intermittently bleeds and may require surgery.  He also had an episode of epididymitis and has been followed by urology.  He states his most recent labs were from his primary physician in September/October 2021 and total cholesterol was 101 from direct LDL 52.  HDL remains low at 22.    Since I last saw him, he has retired.  He developed COVID in December 2022 and had issues with significant hyponatremia with sodium down to 103 for which she was hospitalized in [place] for at least a week.  He also developed significant anemia and was ultimately found to have peptic ulcer disease.  He subsequently was rehospitalized with cholecystitis and underwent cholecystectomy in April 2023.  At times, he did experience some vague chest pain.  He is unaware of palpitations.  He lives in [place].  He underwent recent laboratory as part of the [facility] system which showed a sodium improved at 139 1 month ago.  He presents for reevaluation.  Past Medical History:  Diagnosis Date   Bipolar disorder ([place])    CAD (coronary artery disease)    GERD (gastroesophageal reflux disease)    Hyperlipidemia    Hypertension    Irritable bowel syndrome (IBS)     Past Surgical History:  Procedure Laterality Date   APPENDECTOMY  10/2000   CARDIAC CATHETERIZATION  03/17/2005   low-normal LV function, 2 vessel CAD (subtotal occlusion of LAD, stenosis of small 2nd diagonal and with mid LAD narrowing, total occlusion of distal Cfx);  PTCA and stent of LAD and 2nd diagonal with 3.5x35m Cypher DES (Dr. TShelva Majestic   [facility] 04/14/2005   2.5x220mCypher DES to L Cfx (Dr. ThShelva Majestic  COLONOSCOPY  08/2011   NM MYOCAR PERF WALL MOTION  2011   bruce myoview - normal pattern of perfusion, low risk   TRANSTHORACIC ECHOCARDIOGRAM  2007   mild MR & TR, AV mildly sclerotic    Allergies  Allergen Reactions   Anticoagulant Compound     PLAVIX: Other reaction(s): Other (See Comments) "Bleeding issues"   Clopidogrel Other (See Comments)    "Bleeding issues" "Bleeding issues" "Bleeding issues"   Niacin Other (See Comments)    Other reaction(s): Other (See Comments)  Other reaction(s): Other (See  Comments) Other reaction(s): Other (See Comments)  Other reaction(s): Other (See Comments)  Other reaction(s): Other (See Comments) Other reaction(s): Other (See Comments) Other reaction(s): Other (See Comments)  Other reaction(s): Other (See Comments) Other reaction(s): Other (See Comments) Other reaction(s): Other (See Comments) Other reaction(s): Other (See Comments) Other reaction(s): Other (See Comments)  Other reaction(s): Other (See Comments) Other reaction(s): Other (See Comments)  Other reaction(s): Other (See Comments)  Other reaction(s): Other (See Comments) Other reaction(s): Other (See Comments) Other reaction(s): Other (See Comments)  Other reaction(s): Other (See Comments) Other reaction(s): Other (See Comments) Other reaction(s): Other (See Comments) Other reaction(s): Other (See Comments)    Terbinafine Other (See Comments)   Tizanidine Other (See Comments)    Dry mouth and feeling weird. Other reaction(s): Other (See Comments) Dry mouth and feeling weird. Dry mouth and feeling weird. Other reaction(s): Other (See Comments) Dry mouth and feeling weird. Other reaction(s): Other (See Comments) Dry mouth and feeling weird. Other reaction(s): Other (See Comments)  Dry mouth and  feeling weird.  Dry mouth and feeling weird. Other reaction(s): Other (See Comments) Dry mouth and feeling weird. Other reaction(s): Other (See Comments) Dry mouth and feeling weird.  Dry mouth and feeling weird. Other reaction(s): Other (See Comments) Dry mouth and feeling weird. Dry mouth and feeling weird. Other reaction(s): Other (See Comments) Dry mouth and feeling weird. Other reaction(s): Other (See Comments) Dry mouth and feeling weird. Dry mouth and feeling weird. Other reaction(s): Other (See Comments) Dry mouth and feeling weird. Other reaction(s): Other (See Comments) Dry mouth and feeling weird. Other reaction(s): Other (See Comments)  Dry mouth and feeling weird.  Dry mouth and feeling weird. Other reaction(s): Other (See Comments) Dry mouth and feeling weird. Other reaction(s): Other (See Comments) Dry mouth and feeling weird.  Dry mouth and feeling weird. Other reaction(s): Other (See Comments) Dry mouth and feeling weird. Dry mouth and feeling weird. Other reaction(s): Other (See Comments) Dry mouth and feeling weird. Other reaction(s): Other (See Comments) Dry mouth and feeling weird. Dry mouth and feeling weird. Other reaction(s): Other (See Comments) Dry mouth and feeling weird. Dry mouth and feeling weird. Other reaction(s): Other (See Comments) Dry mouth and feeling weird. Other reaction(s): Other (See Comments) Dry mouth and feeling weird. Dry mouth and feeling weird. Other reaction(s): Other (See Comments) Dry mouth and feeling weird. Other reaction(s): Other (See Comments) Dry mouth and feeling weird.    Meloxicam Nausea Only    Current Outpatient Medications  Medication Sig Dispense Refill   albuterol (PROVENTIL HFA;VENTOLIN HFA) 108 (90 Base) MCG/ACT inhaler Inhale 2 puffs into the lungs every 6 (six) hours as needed for wheezing or shortness of breath.     amLODipine (NORVASC) 5 MG tablet Take 1.5 tablets (7.5 mg total) by mouth daily. 135 tablet 3    aspirin EC 81 MG tablet Take 1 tablet (81 mg total) by mouth daily. 90 tablet 3   co-enzyme Q-10 30 MG capsule Take 200 mg by mouth daily.     Cyanocobalamin (VITAMIN B-12 PO) Take 5,000 mg by mouth daily. 5000 i.u.     D-Ribose POWD 5 g by Does not apply route daily.     dicyclomine (BENTYL) 10 MG capsule TAKE ONE CAPSULE BY MOUTH THREE TIMES A DAY BEFORE MEALS as needed 60 capsule 2   famotidine (PEPCID) 40 MG tablet Take 40 mg by mouth daily.     LevOCARNitine L-Tartrate (L-CARNITINE) 500 MG CAPS Take 500 mg by mouth daily.     LORazepam (  ATIVAN) 1 MG tablet Take 0.5-1 tablets (0.5-1 mg total) by mouth daily as needed for anxiety. 30 tablet 4   losartan (COZAAR) 25 MG tablet Take 1 tablet (25 mg total) by mouth daily. 90 tablet 3   metoprolol succinate (TOPROL XL) 50 MG 24 hr tablet Take 1 tablet (50 mg total) by mouth daily. Take with or immediately following a meal. 90 tablet 3   Multiple Vitamins-Minerals (MULTIVITAMIN WITH MINERALS) tablet Take 1 tablet by mouth daily.     NAFTIN 2 % CREA Apply 1 application topically as needed.     NATTOKINASE PO Take 2,000 mg by mouth.      NITROSTAT 0.4 MG SL tablet PLACE ONE TABLET UNDER THE TONGUE EVERY FIVE MINUTES AS NEEDED FOR CHEST PAIN x 3 doses 25 tablet 4   Omega-3 Fatty Acids (FISH OIL PO) Take 2 g by mouth daily.     pantoprazole (PROTONIX) 40 MG tablet Take by mouth.     rosuvastatin (CRESTOR) 40 MG tablet TAKE ONE TABLET BY MOUTH DAILY 90 tablet 1   tadalafil (CIALIS) 20 MG tablet Take 0.5 tablets (10 mg total) by mouth daily as needed for erectile dysfunction. 40 tablet 0   valACYclovir (VALTREX) 500 MG tablet Take 1 tablet (500 mg total) by mouth daily. Increase to bid x 5 days PRN outbreak 100 tablet 3   lansoprazole (PREVACID) 15 MG capsule Take 30 mg by mouth daily.  (Patient not taking: Reported on 07/21/2021)     No current facility-administered medications for this visit.    Social History   Socioeconomic History   Marital  status: Married    Spouse name: Not on file   Number of children: 3   Years of education: 16   Highest education level: Not on file  Occupational History   Occupation: Press photographer: Theme park manager  Tobacco Use   Smoking status: Never   Smokeless tobacco: Never  Vaping Use   Vaping Use: Never used  Substance and Sexual Activity   Alcohol use: Yes    Alcohol/week: 1.0 standard drink of alcohol    Types: 1 Glasses of wine per week    Comment: rarely    Drug use: No   Sexual activity: Yes  Other Topics Concern   Not on file  Social History Narrative   Marital status: divorced since 2001; significant x 12 years; happy      Children: 2 sons; 4 grandchildren      Lives: with significant other      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      Exercise:  Walking 3 times per week 1-2 miles      Seatbelt: 100%; no texting while driving.   Social Determinants of Health   Financial Resource Strain: Not on file  Food Insecurity: Not on file  Transportation Needs: Not on file  Physical Activity: Not on file  Stress: Not on file  Social Connections: Not on file  Intimate Partner Violence: Not on file   Socially, he is divorced for many years. He has 3 children 2 grandchildren. No tobacco or alcohol use.  His son Harold Newman is currently living in [place].  Family History  Problem Relation Age of Onset   Aplastic anemia Mother    Hypertension Mother    Heart failure Father    Heart disease  Father        CHF   Alcohol abuse Father    Heart attack Maternal Grandfather    Brain cancer Brother    Alcohol abuse Brother    Gout Brother    Hypertension Brother    Parkinson's disease Brother    Cancer Brother 44       brain tumor    ROS General: Negative; No fevers, chills, or night sweats;  HEENT: Negative; No changes in vision or hearing, sinus congestion, difficulty swallowing Pulmonary:  Negative; No cough, wheezing, shortness of breath, hemoptysis Cardiovascular: See HPI;  GI: Positive for GERD; No nausea, vomiting, diarrhea, or abdominal pain GU: Recent epididymitis Musculoskeletal: Negative; no myalgias, joint pain, or weakness Hematologic/Oncology: Negative; no easy bruising, bleeding Endocrine: Negative; no heat/cold intolerance; no diabetes Neuro: Negative; no changes in balance, headaches Skin: Negative; No rashes or skin lesions Psychiatric: Positive for bipolar disorder, stable on medical therapy No behavioral problems, depression Sleep: Negative; No snoring, daytime sleepiness, hypersomnolence, bruxism, restless legs, hypnogognic hallucinations, no cataplexy Other comprehensive 14 point system review is negative.   PE BP 100/70 (BP Location: Left Arm)   Pulse 64   Ht _0  (1.727 m)   Wt 199 lb 6.4 oz (90.4 kg)   SpO2 98%   BMI 30.32 kg/m    Repeat blood pressure by me was 120/68 supine and 120/70 standing  Wt Readings from Last 3 Encounters:  07/21/21 199 lb 6.4 oz (90.4 kg)  03/18/20 208 lb (94.3 kg)  01/19/19 210 lb (95.3 kg)   General: Alert, oriented, no distress.  Skin: normal turgor, no rashes, warm and dry HEENT: Normocephalic, atraumatic. Pupils equal round and reactive to light; sclera anicteric; extraocular muscles intact;  Nose without nasal septal hypertrophy Mouth/Parynx benign; Mallinpatti scale 3 Neck: No JVD, no carotid bruits; normal carotid upstroke Lungs: clear to ausculatation and percussion; no wheezing or rales Chest wall: without tenderness to palpitation Heart: PMI not displaced, RRR, s1 s2 normal, 1/6 systolic murmur, no diastolic murmur, no rubs, gallops, thrills, or heaves Abdomen: soft, nontender; no hepatosplenomehaly, BS+; abdominal aorta nontender and not dilated by palpation. Back: no CVA tenderness Pulses 2+ Musculoskeletal: full range of motion, normal strength, no joint deformities Extremities: no clubbing  cyanosis or edema, Homan's sign negative  Neurologic: grossly nonfocal; Cranial nerves grossly wnl Psychologic: Normal mood and affect   June 26, 20234ECG (independently read by me): NST at 50, no ectopy or ST changes  March 18, 2020 ECG (independently read by me): NSR at 65; Nonspecific ST change; no ectopy.  December 2020 ECG (independently read by me): Normal sinus rhythm at 79 bpm.  No significant ST changes.  Normal intervals.  No ectopy  May 2018 ECG (independently read by me): Normal sinus rhythm at 67 bpm.  PR interval 186 ms, QTc interval 420 ms.  No syncope in ST-T changes.  October 2017 ECG (independently read by me): Normal sinus rhythm at 73 bpm.  Nonspecific ST changes.  QTc interval 420 ms.  ECG (independently read by me): Normal sinus rhythm at 69 bpm.  No ectopy.  Normal intervals.  No significant ST segment changes  June 2015 ECG (independently read by me): Normal sinus rhythm at 63 beats per minute.  Small Q-wave in lead 3.  Normal intervals.  Prior December 2014 ECG: Sinus rhythm at 69 beats per minute. Normal intervals. No ectopy.  LABS:    Latest Ref Rng & Units 11/06/2017    9:14 AM 04/19/2017  3:09 PM 06/27/2016   10:39 AM  BMP  Glucose 70 - 99 mg/dL 106  91  87   BUN 8 - 23 mg/dL _0 Creatinine 0.61 - 1.24 mg/dL 0.78  0.87  0.96   BUN/Creat Ratio 10 - _1 Sodium 135 - 145 mmol/L 135  141  139   Potassium 3.5 - 5.1 mmol/L 4.1  4.6  4.4   Chloride 98 - 111 mmol/L 103  101  100   CO2 22 - 32 mmol/L _2 Calcium 8.9 - 10.3 mg/dL 9.3  9.3  9.6       Latest Ref Rng & Units 04/19/2017    3:09 PM 06/27/2016   10:39 AM 09/07/2015   10:35 AM  Hepatic Function  Total Protein 6.0 - 8.5 g/dL 6.6  7.3  6.6   Albumin 3.6 - 4.8 g/dL 4.7  4.6  4.3   AST 0 - 40 IU/L _3 ALT 0 - 44 IU/L _4 Alk Phosphatase 39 - 117 IU/L 64  63  58   Total Bilirubin 0.0 - 1.2 mg/dL 0.5  0.7  0.7       Latest Ref Rng & Units 11/06/2017    12:08 PM 11/06/2017    9:14 AM 04/19/2017    3:09 PM  CBC  WBC 4.0 - 10.5 K/uL  6.4  7.0   Hemoglobin 13.0 - 17.0 g/dL 14.5  14.8  15.4   Hematocrit 39.0 - 52.0 % 43.7  44.7  46.4   Platelets 150 - 400 K/uL  153  145    Lab Results  Component Value Date   MCV 88.0 11/06/2017   MCV 91 04/19/2017   MCV 91.3 11/07/2016   Lab Results  Component Value Date   TSH 2.710 06/27/2016     Lipid Panel     Component Value Date/Time   CHOL 124 04/19/2017 1509   TRIG 236 (H) 04/19/2017 1509   HDL 23 (L) 04/19/2017 1509   CHOLHDL 5.4 (H) 04/19/2017 1509   CHOLHDL 5.3 (H) 09/07/2015 1035   VLDL 27 09/07/2015 1035   LDLCALC 54 04/19/2017 1509   RADIOLOGY: No results found.  IMPRESSION:  1. Coronary artery disease involving native coronary artery of native heart without angina pectoris   2. Mixed hyperlipidemia   3. Essential hypertension   4. Hyperlipidemia with target LDL less than 70   5. COVID: December 2022   6. Acute hyponatremia: Resolved   7. Mild obesity   8. Peptic ulcer disease     ASSESSMENT AND PLAN: Harold Newman is a58 -year-old Caucasian male who is 16 years s/p two-vessel intervention to a subtotal LAD and chronic total occlusion of the circumflex vessel with successful reperfusion in 2007. Subsequent nuclear studies have shown normal perfusion with his last study in 2011.  I titrated his Toprol to 50 mg in 2016 and had recommended a subsequent stress test which he never had.  He has been symptomatic without chest pain or exertional dyspnea.  He did gain weight as result of decreased exercise during this Covid 19 pandemic.  Since I last saw him in February 2022 he developed COVID in December 2022 and had significant problems resulting from profound type pulm natremia with serum sodium being reduced to 103 requiring hospitalization in [facility] a [facility].  He subsequently developed  peptic ulcer disease and ultimately was rehospitalized and underwent  cholecystectomy for cholecystitis.  During that time he had experienced some episodes of chest pain.  Presently he denies any exertional chest pain or significant shortness of breath.  He has not had any recent evaluation of his coronary anatomy.  His blood pressure today initially was low but on repeat by me was stable without orthostatic change.  He continues to be on amlodipine 7.5 mg, losartan 25 mg daily.  He is on rosuvastatin 40 mg for hyperlipidemia.  He is on pantoprazole for GERD.  He continues to be on aspirin.  His ECG remained stable.  Weight today is stable at 199 with BMI 39.3 consistent with very mild obesity.  I have recommended follow-up laboratory with a comprehensive metabolic panel, CBC, fasting lipid panel and TSH.  Prior to his next office visit in 6 months I have recommended a follow-up exercise Myoview study for reassessment of myocardial perfusion.   Troy Sine, MD, [facility]  07/25/2021 9:04 AM

## 2021-07-25 ENCOUNTER — Encounter: Payer: Self-pay | Admitting: Cardiovascular Disease

## 2021-12-16 ENCOUNTER — Telehealth (HOSPITAL_COMMUNITY): Payer: Self-pay | Admitting: Cardiovascular Disease

## 2021-12-16 NOTE — Telephone Encounter (Signed)
Called pt he states that he is cancelling because he is healing from carpal tunnel surgery and cannot do this test. He states the test was his idea to do it and he is feeling fine.

## 2021-12-16 NOTE — Telephone Encounter (Signed)
Patient does not wish to schedule  the ordered Myoview. He states he will inform Dr. Tresa Endo also. Order removed from the [facility]. Thank you

## 2022-01-09 ENCOUNTER — Ambulatory Visit: Payer: Medicare HMO | Attending: Cardiovascular Disease | Admitting: Cardiovascular Disease

## 2022-01-09 ENCOUNTER — Encounter: Payer: Self-pay | Admitting: Cardiovascular Disease

## 2022-01-09 VITALS — BP 124/72 | HR 72 | Ht 68.0 in | Wt 201.0 lb

## 2022-01-09 DIAGNOSIS — E785 Hyperlipidemia, unspecified: Secondary | ICD-10-CM | POA: Diagnosis not present

## 2022-01-09 DIAGNOSIS — E611 Iron deficiency: Secondary | ICD-10-CM | POA: Diagnosis not present

## 2022-01-09 DIAGNOSIS — K219 Gastro-esophageal reflux disease without esophagitis: Secondary | ICD-10-CM

## 2022-01-09 DIAGNOSIS — U071 COVID-19: Secondary | ICD-10-CM

## 2022-01-09 DIAGNOSIS — K279 Peptic ulcer, site unspecified, unspecified as acute or chronic, without hemorrhage or perforation: Secondary | ICD-10-CM

## 2022-01-09 DIAGNOSIS — I2510 Atherosclerotic heart disease of native coronary artery without angina pectoris: Secondary | ICD-10-CM

## 2022-01-09 DIAGNOSIS — I10 Essential (primary) hypertension: Secondary | ICD-10-CM | POA: Diagnosis not present

## 2022-01-09 DIAGNOSIS — E6609 Other obesity due to excess calories: Secondary | ICD-10-CM

## 2022-01-09 DIAGNOSIS — Z6831 Body mass index (BMI) 31.0-31.9, adult: Secondary | ICD-10-CM

## 2022-01-09 NOTE — Progress Notes (Signed)
Patient ID: Harold Newman, male   DOB: 1955-12-02, 66 y.o.   MRN: [id number]        HPI: ABOU Harold Newman is a 66 y.o. male who presents to the office today for a 6 month follow-up cardiology evaluation.    Mr. Harold Newman has a history of significant hyperlipidemia with markedly elevated small LDL small particles in the past greater than 3000.  In 2007 he was found to have subtotal LAD stenosis as well as total occlusion of the circumflex vessel with collaterals after a nuclear study demonstrated multiple areas of ischemia. He underwent initial intervention to his LAD totally and underwent successful staged intervention to his chronically occluded circumflex vessel with reestablishment of antegrade flow. His last nuclear perfusion study in 2011 continued to show fairly normal perfusion without scar or ischemia.  He has done exceptionally well since that intervention.  However, in February 2015, he noticed a change in symptomatology with a decline in exercise tolerance with exertional dyspnea and also has noticed some mild episodes of chest pain, which is seen to be mild, but similar in quality to his prior discomfort.  I had seen him last year and recommended a subsequent nuclear perfusion study.  He never had this done.  He did have some issues with Plavix in the past causing GI bleeding issues.  He now has been taking nattokinase which he states has alleviated a lot of his symptomatology.  He denies any overt recent GI bleed.  He recently had a URI infection which ultimately cleared after 46 weeks.  During this time, he was not using his regularly he has had in the past.  Laboratory in 2016 revealed a total cholesterol of 123, but his HDL was very low at 20, and his calculated LDL was 75.  In the past.he was noted to have markedly elevated small LDL particles in the setting of very low HDL.  In August 2017 follow-up blood work  showed slight improvement in his lipid status with total cholesterol now 111,  triglycerides 136, HDL 21, VLDL 27, and LDL 63 on his regimen consisting of Crestor 40 mg and he had resumed taking niacin 500 mg as well as fish oil.  He has been taking the full dose aspirin to reduce potential niacin flush.  However, I have suggested he reduce his aspirin to 81 mg.    I saw him in May 2018 at which time he was doing well from a cardiovascular standpoint.  He specifically denied any exertional chest pain.  At times there was some very mild shortness of breath.  He denied palpitations. He was on Crestor 40 mg, niacin 500 mg, coenzyme Q10, and omega-3 fatty acids for his lipids.  He also takes l-carnitine.  He was on Toprol-XL 50 mg, losartan 25 mg daily, and amlodipine 5 mg for blood pressure and his CAD.    He was evaluated in November 2019 by Azalee Course, PA.  Prior to that evaluation he had experienced hematochezia in August 2019 and subsequently underwent colonoscopy with polypectomy with 2 polyps removed.  Subsequently unfortunately he developed bleeding at the site of polypectomy requiring treatment with epinephrine and a suture clip.  He had not had any episodes of recurrent chest pain.  Over the past year, he has seen his primary physician and underwent repeat laboratory in September 2020 which showed a total cholesterol 130, LDL DL 62, and nonfasting triglyceride was 231 with HDL of 22.  Recently, he has been on amlodipine 1.5  mg, losartan 25 mg daily, metoprolol succinate 50 mg daily for blood pressure control.  He has continued to be on rosuvastatin 40 mg daily in addition to EPA 1000 mg 2 capsules daily.  He takes Prevacid for GI issues.  He continues to be on lorazepam as needed.  He continues to take aspirin 81 mg daily.  He also continues to be on nattokinase which he self prescribed feels has had significant benefit.  I saw him in December 2020 . During the Covid 19 pandemic, he has been having to work at home.  As result he has not been walking long distances from his car to  his office and as result has become much more sedentary.  He denied episodes of chest pain or palpitations.  His blood pressure was stable.  He was on rosuvastatin 40 mg and was taking 2000 mg of Vascepa.  Triglycerides remain elevated and I recommended further titration to 2 capsules twice a day.  I saw him on March 18, 2020 at which time he remained stable.   He will be retiring in the next 2 months.  During the week he lives in [place].  He denies chest pain.  He does have a lesion on his tongue which intermittently bleeds and may require surgery.  He also had an episode of epididymitis and has been followed by urology.  He states his most recent labs were from his primary physician in September/October 2021 and total cholesterol was 101 from direct LDL 52.  HDL remains low at 22.    I last saw him on July 21, 2021.  Since his prior evaluation he had retired.  He developed COVID in December 2022 and had issues with significant hyponatremia with sodium down to 103 for which she was hospitalized in [place] for at least a week.  He also developed significant anemia and was ultimately found to have peptic ulcer disease.  He subsequently was rehospitalized with cholecystitis and underwent cholecystectomy in April 2023.  At times, he did experience some vague chest pain.  He is unaware of palpitations.  He lives in [place].  He underwent recent laboratory as part of the [facility] system which showed a sodium improved at 139 one month ago.    Presently, he feels well from a cardiac standpoint.  He specifically denies chest pain or shortness of breath.  He underwent right carpal tunnel surgery in December 03, 2021 at [facility].  He has been walking 1/2 to 1 mile every other day.  He is iron deficient and had a ferritin of 9 and is now on iron replacement therapy every other day.  Recent laboratory on October 22, 2021 showed total cholesterol 109, LDL 65 HDL 18 and triglycerides 128.  He presents for evaluation.   Past  Medical History:  Diagnosis Date   Bipolar disorder (HCC)    CAD (coronary artery disease)    GERD (gastroesophageal reflux disease)    Hyperlipidemia    Hypertension    Irritable bowel syndrome (IBS)     Past Surgical History:  Procedure Laterality Date   APPENDECTOMY  10/2000   CARDIAC CATHETERIZATION  03/17/2005   low-normal LV function, 2 vessel CAD (subtotal occlusion of LAD, stenosis of small 2nd diagonal and with mid LAD narrowing, total occlusion of distal Cfx); PTCA and stent of LAD and 2nd diagonal with 3.5x24mm Cypher DES (Dr. Nicki Guadalajara)   CARDIAC CATHETERIZATION  04/14/2005   2.5x60mm Cypher DES to L Cfx (Dr. Nicki Guadalajara)  COLONOSCOPY  08/2011   NM MYOCAR PERF WALL MOTION  2011   bruce myoview - normal pattern of perfusion, low risk   TRANSTHORACIC ECHOCARDIOGRAM  2007   mild MR & TR, AV mildly sclerotic    Allergies  Allergen Reactions   Anticoagulant Compound     PLAVIX: Other reaction(s): Other (See Comments) "Bleeding issues"   Clopidogrel Other (See Comments)    "Bleeding issues" "Bleeding issues" "Bleeding issues"   Niacin Other (See Comments)    Other reaction(s): Other (See Comments)  Other reaction(s): Other (See Comments) Other reaction(s): Other (See Comments)  Other reaction(s): Other (See Comments)  Other reaction(s): Other (See Comments) Other reaction(s): Other (See Comments) Other reaction(s): Other (See Comments)  Other reaction(s): Other (See Comments) Other reaction(s): Other (See Comments) Other reaction(s): Other (See Comments) Other reaction(s): Other (See Comments) Other reaction(s): Other (See Comments)  Other reaction(s): Other (See Comments) Other reaction(s): Other (See Comments)  Other reaction(s): Other (See Comments)  Other reaction(s): Other (See Comments) Other reaction(s): Other (See Comments) Other reaction(s): Other (See Comments)  Other reaction(s): Other (See Comments) Other reaction(s): Other (See  Comments) Other reaction(s): Other (See Comments) Other reaction(s): Other (See Comments)    Terbinafine Other (See Comments)   Tizanidine Other (See Comments)    Dry mouth and feeling weird. Other reaction(s): Other (See Comments) Dry mouth and feeling weird. Dry mouth and feeling weird. Other reaction(s): Other (See Comments) Dry mouth and feeling weird. Other reaction(s): Other (See Comments) Dry mouth and feeling weird. Other reaction(s): Other (See Comments)  Dry mouth and feeling weird.  Dry mouth and feeling weird. Other reaction(s): Other (See Comments) Dry mouth and feeling weird. Other reaction(s): Other (See Comments) Dry mouth and feeling weird.  Dry mouth and feeling weird. Other reaction(s): Other (See Comments) Dry mouth and feeling weird. Dry mouth and feeling weird. Other reaction(s): Other (See Comments) Dry mouth and feeling weird. Other reaction(s): Other (See Comments) Dry mouth and feeling weird. Dry mouth and feeling weird. Other reaction(s): Other (See Comments) Dry mouth and feeling weird. Other reaction(s): Other (See Comments) Dry mouth and feeling weird. Other reaction(s): Other (See Comments)  Dry mouth and feeling weird.  Dry mouth and feeling weird. Other reaction(s): Other (See Comments) Dry mouth and feeling weird. Other reaction(s): Other (See Comments) Dry mouth and feeling weird.  Dry mouth and feeling weird. Other reaction(s): Other (See Comments) Dry mouth and feeling weird. Dry mouth and feeling weird. Other reaction(s): Other (See Comments) Dry mouth and feeling weird. Other reaction(s): Other (See Comments) Dry mouth and feeling weird. Dry mouth and feeling weird. Other reaction(s): Other (See Comments) Dry mouth and feeling weird. Dry mouth and feeling weird. Other reaction(s): Other (See Comments) Dry mouth and feeling weird. Other reaction(s): Other (See Comments) Dry mouth and feeling weird. Dry mouth and feeling weird. Other  reaction(s): Other (See Comments) Dry mouth and feeling weird. Other reaction(s): Other (See Comments) Dry mouth and feeling weird.    Meloxicam Nausea Only    Current Outpatient Medications  Medication Sig Dispense Refill   albuterol (PROVENTIL HFA;VENTOLIN HFA) 108 (90 Base) MCG/ACT inhaler Inhale 2 puffs into the lungs every 6 (six) hours as needed for wheezing or shortness of breath.     amLODipine (NORVASC) 5 MG tablet Take 1.5 tablets (7.5 mg total) by mouth daily. 135 tablet 3   aspirin EC 81 MG tablet Take 1 tablet (81 mg total) by mouth daily. 90 tablet 3   co-enzyme Q-10 30 MG capsule  Take 200 mg by mouth daily.     Cyanocobalamin (VITAMIN B-12 PO) Take 5,000 mg by mouth daily. 5000 i.u.     D-Ribose POWD 5 g by Does not apply route daily.     dicyclomine (BENTYL) 10 MG capsule TAKE ONE CAPSULE BY MOUTH THREE TIMES A DAY BEFORE MEALS as needed 60 capsule 2   famotidine (PEPCID) 40 MG tablet Take 40 mg by mouth daily.     LevOCARNitine L-Tartrate (L-CARNITINE) 500 MG CAPS Take 500 mg by mouth daily.     LORazepam (ATIVAN) 1 MG tablet Take 0.5-1 tablets (0.5-1 mg total) by mouth daily as needed for anxiety. 30 tablet 4   losartan (COZAAR) 25 MG tablet Take 1 tablet (25 mg total) by mouth daily. 90 tablet 3   metoprolol succinate (TOPROL XL) 50 MG 24 hr tablet Take 1 tablet (50 mg total) by mouth daily. Take with or immediately following a meal. 90 tablet 3   Multiple Vitamins-Minerals (MULTIVITAMIN WITH MINERALS) tablet Take 1 tablet by mouth daily.     NAFTIN 2 % CREA Apply 1 application topically as needed.     NITROSTAT 0.4 MG SL tablet PLACE ONE TABLET UNDER THE TONGUE EVERY FIVE MINUTES AS NEEDED FOR CHEST PAIN x 3 doses 25 tablet 4   Omega-3 Fatty Acids (FISH OIL PO) Take 2 g by mouth daily.     pantoprazole (PROTONIX) 40 MG tablet Take by mouth.     rosuvastatin (CRESTOR) 40 MG tablet TAKE ONE TABLET BY MOUTH DAILY 90 tablet 1   tadalafil (CIALIS) 20 MG tablet Take 0.5  tablets (10 mg total) by mouth daily as needed for erectile dysfunction. 40 tablet 0   valACYclovir (VALTREX) 500 MG tablet Take 1 tablet (500 mg total) by mouth daily. Increase to bid x 5 days PRN outbreak 100 tablet 3   lansoprazole (PREVACID) 15 MG capsule Take 30 mg by mouth daily.  (Patient not taking: Reported on 01/09/2022)     NATTOKINASE PO Take 2,000 mg by mouth.  (Patient not taking: Reported on 01/09/2022)     No current facility-administered medications for this visit.    Social History   Socioeconomic History   Marital status: Married    Spouse name: Not on file   Number of children: 3   Years of education: 16   Highest education level: Not on file  Occupational History   Occupation: Education administrator: Advertising copywriter  Tobacco Use   Smoking status: Never   Smokeless tobacco: Never  Vaping Use   Vaping Use: Never used  Substance and Sexual Activity   Alcohol use: Yes    Alcohol/week: 1.0 standard drink of alcohol    Types: 1 Glasses of wine per week    Comment: rarely    Drug use: No   Sexual activity: Yes  Other Topics Concern   Not on file  Social History Narrative   Marital status: divorced since 2001; significant x 12 years; happy      Children: 2 sons; 4 grandchildren      Lives: with significant other      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      Exercise:  Walking 3 times per week 1-2 miles      Seatbelt: 100%; no texting while driving.   Social Determinants of Health  Financial Resource Strain: Not on file  Food Insecurity: Not on file  Transportation Needs: Not on file  Physical Activity: Not on file  Stress: Not on file  Social Connections: Not on file  Intimate Partner Violence: Not on file   Socially, he is divorced for many years. He has 3 children 2 grandchildren. No tobacco or alcohol use.  His son Almeta Monas is currently living in [place].  Family  History  Problem Relation Age of Onset   Aplastic anemia Mother    Hypertension Mother    Heart failure Father    Heart disease Father        CHF   Alcohol abuse Father    Heart attack Maternal Grandfather    Brain cancer Brother    Alcohol abuse Brother    Gout Brother    Hypertension Brother    Parkinson's disease Brother    Cancer Brother 64       brain tumor    ROS General: Negative; No fevers, chills, or night sweats;  HEENT: Negative; No changes in vision or hearing, sinus congestion, difficulty swallowing Pulmonary: Negative; No cough, wheezing, shortness of breath, hemoptysis Cardiovascular: See HPI;  GI: Positive for GERD; No nausea, vomiting, diarrhea, or abdominal pain GU: Recent epididymitis Musculoskeletal: Negative; no myalgias, joint pain, or weakness Hematologic/Oncology: Negative; no easy bruising, bleeding Endocrine: Negative; no heat/cold intolerance; no diabetes Neuro: Negative; no changes in balance, headaches Skin: Negative; No rashes or skin lesions Psychiatric: Positive for bipolar disorder, stable on medical therapy No behavioral problems, depression Sleep: Negative; No snoring, daytime sleepiness, hypersomnolence, bruxism, restless legs, hypnogognic hallucinations, no cataplexy Other comprehensive 14 point system review is negative.   PE BP 124/72 (BP Location: Left Arm, Patient Position: Sitting, Cuff Size: Large)   Pulse 72   Ht 5\' 8"  (1.727 m)   Wt 201 lb (91.2 kg)   SpO2 98%   BMI 30.56 kg/m    Repeat blood pressure by me was 116/70.  Wt Readings from Last 3 Encounters:  01/09/22 201 lb (91.2 kg)  07/21/21 199 lb 6.4 oz (90.4 kg)  03/18/20 208 lb (94.3 kg)   General: Alert, oriented, no distress.  Skin: normal turgor, no rashes, warm and dry HEENT: Normocephalic, atraumatic. Pupils equal round and reactive to light; sclera anicteric; extraocular muscles intact;  Nose without nasal septal hypertrophy Mouth/Parynx benign; Mallinpatti  scale 3 Neck: No JVD, no carotid bruits; normal carotid upstroke Lungs: clear to ausculatation and percussion; no wheezing or rales Chest wall: without tenderness to palpitation Heart: PMI not displaced, RRR, s1 s2 normal, 1/6 systolic murmur, no diastolic murmur, no rubs, gallops, thrills, or heaves Abdomen: soft, nontender; no hepatosplenomehaly, BS+; abdominal aorta nontender and not dilated by palpation. Back: no CVA tenderness Pulses 2+ Musculoskeletal: full range of motion, normal strength, no joint deformities Extremities: no clubbing cyanosis or edema, Homan's sign negative  Neurologic: grossly nonfocal; Cranial nerves grossly wnl Psychologic: Normal mood and affect   January 09, 2022 ECG (independently read by me): NSR at 72, Q in III  July 21, 2021 ECG (independently read by me): NST at 64, no ectopy or ST changes  March 18, 2020 ECG (independently read by me): NSR at 65; Nonspecific ST change; no ectopy.  December 2020 ECG (independently read by me): Normal sinus rhythm at 79 bpm.  No significant ST changes.  Normal intervals.  No ectopy  May 2018 ECG (independently read by me): Normal sinus rhythm at 67 bpm.  PR interval 186 ms,  QTc interval 420 ms.  No syncope in ST-T changes.  October 2017 ECG (independently read by me): Normal sinus rhythm at 73 bpm.  Nonspecific ST changes.  QTc interval 420 ms.  ECG (independently read by me): Normal sinus rhythm at 69 bpm.  No ectopy.  Normal intervals.  No significant ST segment changes  June 2015 ECG (independently read by me): Normal sinus rhythm at 63 beats per minute.  Small Q-wave in lead 3.  Normal intervals.  Prior December 2014 ECG: Sinus rhythm at 69 beats per minute. Normal intervals. No ectopy.  LABS:    Latest Ref Rng & Units 11/06/2017    9:14 AM 04/19/2017    3:09 PM 06/27/2016   10:39 AM  BMP  Glucose 70 - 99 mg/dL 884  91  87   BUN 8 - 23 mg/dL 17  13  11    Creatinine 0.61 - 1.24 mg/dL 1.66  0.63  0.16    BUN/Creat Ratio 10 - 24  15  11    Sodium 135 - 145 mmol/L 135  141  139   Potassium 3.5 - 5.1 mmol/L 4.1  4.6  4.4   Chloride 98 - 111 mmol/L 103  101  100   CO2 22 - 32 mmol/L 23  24  24    Calcium 8.9 - 10.3 mg/dL 9.3  9.3  9.6       Latest Ref Rng & Units 04/19/2017    3:09 PM 06/27/2016   10:39 AM 09/07/2015   10:35 AM  Hepatic Function  Total Protein 6.0 - 8.5 g/dL 6.6  7.3  6.6   Albumin 3.6 - 4.8 g/dL 4.7  4.6  4.3   AST 0 - 40 IU/L 24  27  23    ALT 0 - 44 IU/L 20  31  21    Alk Phosphatase 39 - 117 IU/L 64  63  58   Total Bilirubin 0.0 - 1.2 mg/dL 0.5  0.7  0.7       Latest Ref Rng & Units 11/06/2017   12:08 PM 11/06/2017    9:14 AM 04/19/2017    3:09 PM  CBC  WBC 4.0 - 10.5 K/uL  6.4  7.0   Hemoglobin 13.0 - 17.0 g/dL 01.0  93.2  35.5   Hematocrit 39.0 - 52.0 % 43.7  44.7  46.4   Platelets 150 - 400 K/uL  153  145    Lab Results  Component Value Date   MCV 88.0 11/06/2017   MCV 91 04/19/2017   MCV 91.3 11/07/2016   Lab Results  Component Value Date   TSH 2.710 06/27/2016     Lipid Panel     Component Value Date/Time   CHOL 124 04/19/2017 1509   TRIG 236 (H) 04/19/2017 1509   HDL 23 (L) 04/19/2017 1509   CHOLHDL 5.4 (H) 04/19/2017 1509   CHOLHDL 5.3 (H) 09/07/2015 1035   VLDL 27 09/07/2015 1035   LDLCALC 54 04/19/2017 1509   RADIOLOGY: No results found.  IMPRESSION:  1. Coronary artery disease involving native coronary artery of native heart without angina pectoris   2. Essential hypertension   3. Iron deficiency   4. Hyperlipidemia with target LDL less than 70   5. Class 1 obesity due to excess calories with serious comorbidity and body mass index (BMI) of 31.0 to 31.9 in adult   6. Peptic ulcer disease   7. Gastroesophageal reflux disease without esophagitis   8. COVID: December 2022  ASSESSMENT AND PLAN: Mr. Fabrizzio Gelhar is a 26 -year-old Caucasian male who is 16 years s/p two-vessel intervention to a subtotal LAD and chronic total  occlusion of the circumflex vessel with successful reperfusion in 2007. Subsequent nuclear studies have shown normal perfusion with his last study in 2011.  I titrated his Toprol to 50 mg in 2016 and had recommended a subsequent stress test which he never had.  He has been symptomatic without chest pain or exertional dyspnea.  He did gain weight as result of decreased exercise during this Covid 19 pandemic.  Since I last saw him in February 2022 he developed COVID in December 2022 and had significant problems resulting from profound type pulm natremia with serum sodium being reduced to 103 requiring hospitalization in [facility] for a week.  He  developed peptic ulcer disease and ultimately was rehospitalized and underwent cholecystectomy for cholecystitis.  During that time he had experienced some episodes of chest pain.  When I saw him in June 2023 he was not having any chest pain or significant shortness of breath.  Presently, he continues to be without anginal symptoms or significant shortness of breath.  He successfully underwent right carpal tunnel surgery on December 03, 2020 at [facility].  He is being followed also for iron deficiency and had ferritin level of 9.  He is now on supplemental iron replacement.  He is to undergo evaluation to assess for GI blood loss and then possibly hematologic evaluation.  He continues to be on rosuvastatin 40 mg for hyperlipidemia and most recent laboratory showed total cholesterol 109 LDL 65 and triglycerides 128.  HDL was very low at 18.  His blood pressure is stable on amlodipine 5 mg and losartan 25 mg daily.  He is taking pantoprazole for GERD.  He is now living in [place].  He is followed in the [facility] system.  I will see him in 1 year for follow-up Cardiologic evaluation or sooner as needed.  Lennette Bihari, MD, [facility]  01/15/2022 3:48 PM      q

## 2022-01-09 NOTE — Patient Instructions (Signed)
Medication Instructions:  No changes *If you need a refill on your cardiac medications before your next appointment, please call your pharmacy*   Lab Work: No labs If you have labs (blood work) drawn today and your tests are completely normal, you will receive your results only by: MyChart Message (if you have MyChart) OR A paper copy in the mail If you have any lab test that is abnormal or we need to change your treatment, we will call you to review the results.   Testing/Procedures: No Testing   Follow-Up: At [facility], you and your health needs are our priority.  As part of our continuing mission to provide you with exceptional heart care, we have created designated Provider Care Teams.  These Care Teams include your primary Cardiologist (physician) and Advanced Practice Providers (APPs -  Physician Assistants and Nurse Practitioners) who all work together to provide you with the care you need, when you need it.  We recommend signing up for the patient portal called "MyChart".  Sign up information is provided on this After Visit Summary.  MyChart is used to connect with patients for Virtual Visits (Telemedicine).  Patients are able to view lab/test results, encounter notes, upcoming appointments, etc.  Non-urgent messages can be sent to your provider as well.   To learn more about what you can do with MyChart, go to [email or web address].    Your next appointment:   1 year(s)  The format for your next appointment:   In Person  Provider:   Nicki Guadalajara, MD

## 2022-01-15 ENCOUNTER — Encounter: Payer: Self-pay | Admitting: Cardiovascular Disease

## 2022-11-05 ENCOUNTER — Ambulatory Visit: Payer: Medicare HMO | Admitting: Cardiovascular Disease

## 2022-11-06 ENCOUNTER — Encounter: Payer: Self-pay | Admitting: Cardiovascular Disease

## 2022-11-20 ENCOUNTER — Telehealth: Payer: Self-pay | Admitting: *Deleted

## 2022-11-20 NOTE — Telephone Encounter (Signed)
LMOVM requesting call back to Clinical Supervisor direct line.

## 2022-11-23 NOTE — Telephone Encounter (Signed)
Patient returned call. Concerns addressed. Pt primarily resides in [place] and plans to transfer his care to cardiologist at [facility]. Offered to send him a records request form, which pt declined as he states he will have his new provider take care of this. He verbalized appreciation of call and denies questions at this time.
